# Patient Record
Sex: Female | Born: 1985 | Race: Black or African American | Hispanic: No | Marital: Single | Smoking: Never smoker
Health system: Southern US, Community
[De-identification: ages and names within clinical notes are randomized; demographics above are authoritative.]

## PROBLEM LIST (undated history)

## (undated) ENCOUNTER — Inpatient Hospital Stay (HOSPITAL_COMMUNITY): Payer: Self-pay

## (undated) DIAGNOSIS — O139 Gestational [pregnancy-induced] hypertension without significant proteinuria, unspecified trimester: Secondary | ICD-10-CM

## (undated) DIAGNOSIS — A549 Gonococcal infection, unspecified: Secondary | ICD-10-CM

## (undated) DIAGNOSIS — A749 Chlamydial infection, unspecified: Secondary | ICD-10-CM

## (undated) DIAGNOSIS — D649 Anemia, unspecified: Secondary | ICD-10-CM

## (undated) DIAGNOSIS — Z789 Other specified health status: Secondary | ICD-10-CM

## (undated) DIAGNOSIS — J45909 Unspecified asthma, uncomplicated: Secondary | ICD-10-CM

## (undated) DIAGNOSIS — R011 Cardiac murmur, unspecified: Secondary | ICD-10-CM

## (undated) HISTORY — PX: EAR TUBE REMOVAL: SHX1486

## (undated) HISTORY — PX: ADENOIDECTOMY: SHX5191

## (undated) HISTORY — PX: TONSILLECTOMY: SUR1361

---
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
## 2010-01-10 ENCOUNTER — Emergency Department: Payer: Self-pay | Admitting: Emergency Medicine

## 2010-03-22 ENCOUNTER — Emergency Department (HOSPITAL_COMMUNITY): Admission: EM | Admit: 2010-03-22 | Discharge: 2010-03-22 | Payer: Self-pay | Admitting: Emergency Medicine

## 2010-06-20 ENCOUNTER — Emergency Department: Payer: Medicaid Other | Admitting: Emergency Medicine

## 2010-12-07 LAB — RUBELLA ANTIBODY, IGM: Rubella: IMMUNE

## 2010-12-07 LAB — ABO/RH: RH Type: POSITIVE

## 2010-12-07 LAB — GC/CHLAMYDIA PROBE AMP, GENITAL
Chlamydia: NEGATIVE
Gonorrhea: NEGATIVE

## 2010-12-07 LAB — RPR: RPR: NONREACTIVE

## 2010-12-07 LAB — HEPATITIS B SURFACE ANTIGEN: Hepatitis B Surface Ag: NEGATIVE

## 2010-12-07 LAB — CBC: Hemoglobin: 11.4 g/dL — AB (ref 12.0–16.0)

## 2011-05-05 LAB — STREP B DNA PROBE: GBS: NEGATIVE

## 2011-05-11 ENCOUNTER — Inpatient Hospital Stay (HOSPITAL_COMMUNITY)
Admission: AD | Admit: 2011-05-11 | Discharge: 2011-05-11 | Disposition: A | Discharge status: Home / Self Care | Payer: Medicaid Other | Source: Ambulatory Visit | Attending: Obstetrics & Gynecology | Admitting: Obstetrics & Gynecology

## 2011-05-11 ENCOUNTER — Encounter (HOSPITAL_COMMUNITY): Payer: Self-pay | Admitting: *Deleted

## 2011-05-11 DIAGNOSIS — O2390 Unspecified genitourinary tract infection in pregnancy, unspecified trimester: Secondary | ICD-10-CM

## 2011-05-11 DIAGNOSIS — N390 Urinary tract infection, site not specified: Secondary | ICD-10-CM

## 2011-05-11 DIAGNOSIS — M549 Dorsalgia, unspecified: Secondary | ICD-10-CM

## 2011-05-11 DIAGNOSIS — O2340 Unspecified infection of urinary tract in pregnancy, unspecified trimester: Secondary | ICD-10-CM

## 2011-05-11 HISTORY — DX: Other specified health status: Z78.9

## 2011-05-11 LAB — URINALYSIS, ROUTINE W REFLEX MICROSCOPIC
Bilirubin Urine: NEGATIVE
Hgb urine dipstick: NEGATIVE
Ketones, ur: 15 mg/dL — AB
Nitrite: NEGATIVE
Protein, ur: NEGATIVE mg/dL
Specific Gravity, Urine: 1.02 (ref 1.005–1.030)
Urobilinogen, UA: 0.2 mg/dL (ref 0.0–1.0)

## 2011-05-11 MED ORDER — PHENAZOPYRIDINE HCL 100 MG PO TABS: 100.0000 mg | ORAL_TABLET | Freq: Three times a day (TID) | ORAL | Status: AC | PRN

## 2011-05-11 MED ORDER — ZOLPIDEM TARTRATE 5 MG PO TABS
5.0000 mg | ORAL_TABLET | Freq: Every evening | ORAL | Status: DC | PRN
  Administered 2011-05-11: 5 mg via ORAL
  Filled 2011-05-11: qty 1

## 2011-05-11 MED ORDER — CYCLOBENZAPRINE HCL 5 MG PO TABS: 5.0000 mg | ORAL_TABLET | Freq: Three times a day (TID) | ORAL | Status: AC | PRN

## 2011-05-11 MED ORDER — CEPHALEXIN 500 MG PO CAPS: 500.0000 mg | ORAL_CAPSULE | Freq: Four times a day (QID) | ORAL | Status: AC

## 2011-05-11 NOTE — L&D Delivery Note (Signed)
Delivery Note At 2:45 PM a viable and healthy female was delivered via  (Presentation:right occiput ; anterior ).  APGAR:9 ,9 ; 5#14weight .   Placenta status: delivered spontaneously,intact with 3V Cord .  Cord: 3V  Patient delivered a vigorous female infant in ROA presentation weighing 5#14oz.  Infant was passed to maternal abdomen and the cord was clamped and cut.  PLacenta delivered spontaneously, intact with 3VC.  Vagina and perineum were without laceration.  EBL 300.  Uterus was slightly atonic but responded well to bimanual massage. Misoprostal was placed transrectally prophylactly given that patient is not candidate for methergine due to hypertension.  Anesthesia: Epidural  Episiotomy: none Lacerations: none Suture Repair: none Est. Blood Loss (mL): 300 Mom to postpartum.  Baby to nursery-stable.  Rashell Shambaugh H. 05/29/2011, 3:01 PM

## 2011-05-11 NOTE — Progress Notes (Signed)
Pt c/o UTI symptoms; c/o intermittent lower back and lower abdominal pain since this AM; pt's lower back is very uncomfortable;

## 2011-05-11 NOTE — ED Provider Notes (Signed)
Loretta Ellis is a 26 y.o. female @ [redacted]w[redacted]d gestation presents with ? Rupture of membranes.  States she has been feeling different since 5am.  She is a patient of [employer]  On exam there is no vaginal bleeding or pooling of fluid.  Fern test is Negative.  Cervical exam by [facility], RN and reported findings to Dr. Aldona Bar.     Matt Holmes, NP 05/11/11 1729  Results for orders placed during the hospital encounter of 05/11/11 (from the past 24 hour(s))  URINALYSIS, ROUTINE W REFLEX MICROSCOPIC     Status: Abnormal   Collection Time   05/11/11  4:45 PM      Component Value Range   Color, Urine YELLOW  YELLOW    APPearance CLEAR  CLEAR    Specific Gravity, Urine 1.020  1.005 - 1.030    pH 6.5  5.0 - 8.0    Glucose, UA NEGATIVE  NEGATIVE (mg/dL)   Hgb urine dipstick NEGATIVE  NEGATIVE    Bilirubin Urine NEGATIVE  NEGATIVE    Ketones, ur 15 (*) NEGATIVE (mg/dL)   Protein, ur NEGATIVE  NEGATIVE (mg/dL)   Urobilinogen, UA 0.2  0.0 - 1.0 (mg/dL)   Nitrite NEGATIVE  NEGATIVE    Leukocytes, UA MODERATE (*) NEGATIVE   URINE MICROSCOPIC-ADD ON     Status: Abnormal   Collection Time   05/11/11  4:45 PM      Component Value Range   Squamous Epithelial / LPF MANY (*) RARE    WBC, UA 11-20  <3 (WBC/hpf)   Bacteria, UA RARE  RARE    Crystals CA OXALATE CRYSTALS (*) NEGATIVE    Dr. Aldona Bar notified of patient status by RN. Will treat UTI while urine culture pending. He request that the patient have Ambien and Flexeril if needed for her back pain and to help her sleep. She is to follow up in the office.   [place]shine, NP 05/11/11 1811  Lowry, NP 05/11/11 1819

## 2011-05-12 LAB — URINE CULTURE
Colony Count: 25000
Culture  Setup Time: 201301020035

## 2011-05-28 ENCOUNTER — Encounter (HOSPITAL_COMMUNITY): Payer: Self-pay | Admitting: *Deleted

## 2011-05-28 ENCOUNTER — Inpatient Hospital Stay (HOSPITAL_COMMUNITY)
Admission: AD | Admit: 2011-05-28 | Discharge: 2011-05-31 | DRG: 774 | Disposition: A | Discharge status: Home / Self Care | Payer: Medicaid Other | Source: Ambulatory Visit | Attending: Obstetrics and Gynecology | Admitting: Obstetrics and Gynecology

## 2011-05-28 DIAGNOSIS — O99892 Other specified diseases and conditions complicating childbirth: Secondary | ICD-10-CM | POA: Diagnosis present

## 2011-05-28 DIAGNOSIS — O133 Gestational [pregnancy-induced] hypertension without significant proteinuria, third trimester: Secondary | ICD-10-CM

## 2011-05-28 DIAGNOSIS — R030 Elevated blood-pressure reading, without diagnosis of hypertension: Secondary | ICD-10-CM | POA: Diagnosis present

## 2011-05-28 HISTORY — DX: Gonococcal infection, unspecified: A54.9

## 2011-05-28 HISTORY — DX: Chlamydial infection, unspecified: A74.9

## 2011-05-28 LAB — COMPREHENSIVE METABOLIC PANEL
ALT: 13 U/L (ref 0–35)
Albumin: 2.8 g/dL — ABNORMAL LOW (ref 3.5–5.2)
Calcium: 9.7 mg/dL (ref 8.4–10.5)
GFR calc Af Amer: >90 mL/min (ref >90)
Glucose, Bld: 113 mg/dL — ABNORMAL HIGH (ref 70–99)
Sodium: 136 mEq/L (ref 135–145)
Total Protein: 6.6 g/dL (ref 6.0–8.3)

## 2011-05-28 LAB — URINALYSIS, ROUTINE W REFLEX MICROSCOPIC
Glucose, UA: NEGATIVE mg/dL
Protein, ur: NEGATIVE mg/dL
Specific Gravity, Urine: <1.005 — ABNORMAL LOW (ref 1.005–1.030)
Urobilinogen, UA: 0.2 mg/dL (ref 0.0–1.0)

## 2011-05-28 LAB — URIC ACID: Uric Acid, Serum: 5.2 mg/dL (ref 2.4–7.0)

## 2011-05-28 LAB — CBC
Hemoglobin: 10.6 g/dL — ABNORMAL LOW (ref 12.0–15.0)
MCH: 27.7 pg (ref 26.0–34.0)
MCHC: 32.3 g/dL (ref 30.0–36.0)
RDW: 14.9 % (ref 11.5–15.5)

## 2011-05-28 LAB — URINE MICROSCOPIC-ADD ON

## 2011-05-28 MED ORDER — TERBUTALINE SULFATE 1 MG/ML IJ SOLN: 0.2500 mg | Freq: Once | INTRAMUSCULAR | Status: AC | PRN

## 2011-05-28 MED ORDER — LIDOCAINE HCL (PF) 1 % IJ SOLN
30.0000 mL | INTRAMUSCULAR | Status: DC | PRN
  Filled 2011-05-28: qty 30

## 2011-05-28 MED ORDER — OXYCODONE-ACETAMINOPHEN 5-325 MG PO TABS: 2.0000 | ORAL_TABLET | ORAL | Status: DC | PRN

## 2011-05-28 MED ORDER — ONDANSETRON HCL 4 MG/2ML IJ SOLN: 4.0000 mg | Freq: Four times a day (QID) | INTRAMUSCULAR | Status: DC | PRN

## 2011-05-28 MED ORDER — ACETAMINOPHEN 325 MG PO TABS
650.0000 mg | ORAL_TABLET | Freq: Once | ORAL | Status: AC
  Administered 2011-05-28: 650 mg via ORAL
  Filled 2011-05-28: qty 2

## 2011-05-28 MED ORDER — LACTATED RINGERS IV SOLN
INTRAVENOUS | Status: DC
  Administered 2011-05-29 (×2): via INTRAVENOUS

## 2011-05-28 MED ORDER — ACETAMINOPHEN 325 MG PO TABS: 650.0000 mg | ORAL_TABLET | ORAL | Status: DC | PRN

## 2011-05-28 MED ORDER — LACTATED RINGERS IV SOLN: 500.0000 mL | INTRAVENOUS | Status: DC | PRN

## 2011-05-28 MED ORDER — IBUPROFEN 600 MG PO TABS: 600.0000 mg | ORAL_TABLET | Freq: Four times a day (QID) | ORAL | Status: DC | PRN

## 2011-05-28 MED ORDER — BUTORPHANOL TARTRATE 2 MG/ML IJ SOLN
1.0000 mg | INTRAMUSCULAR | Status: DC | PRN
  Administered 2011-05-29 (×3): 1 mg via INTRAVENOUS
  Filled 2011-05-28 (×3): qty 1

## 2011-05-28 MED ORDER — FLEET ENEMA 7-19 GM/118ML RE ENEM: 1.0000 | ENEMA | RECTAL | Status: DC | PRN

## 2011-05-28 MED ORDER — OXYTOCIN 20 UNITS IN LACTATED RINGERS INFUSION - SIMPLE
125.0000 mL/h | Freq: Once | INTRAVENOUS | Status: DC
  Administered 2011-05-29: 125 mL/h via INTRAVENOUS

## 2011-05-28 MED ORDER — OXYTOCIN BOLUS FROM INFUSION
500.0000 mL | Freq: Once | INTRAVENOUS | Status: DC
  Administered 2011-05-29: 500 mL via INTRAVENOUS
  Filled 2011-05-28: qty 500
  Filled 2011-05-28: qty 1000

## 2011-05-28 MED ORDER — ZOLPIDEM TARTRATE 10 MG PO TABS: 10.0000 mg | ORAL_TABLET | Freq: Every evening | ORAL | Status: DC | PRN

## 2011-05-28 MED ORDER — CITRIC ACID-SODIUM CITRATE 334-500 MG/5ML PO SOLN: 30.0000 mL | ORAL | Status: DC | PRN

## 2011-05-28 MED ORDER — LABETALOL HCL 5 MG/ML IV SOLN
10.0000 mg | INTRAVENOUS | Status: DC | PRN
  Filled 2011-05-28: qty 4

## 2011-05-28 MED ORDER — MISOPROSTOL 25 MCG QUARTER TABLET
25.0000 ug | ORAL_TABLET | ORAL | Status: DC | PRN
  Administered 2011-05-28 – 2011-05-29 (×3): 25 ug via VAGINAL
  Filled 2011-05-28 (×3): qty 0.25

## 2011-05-28 NOTE — ED Notes (Signed)
MAU provider to see due to back pain and elevated BP's

## 2011-05-28 NOTE — Progress Notes (Signed)
Patient states she has been having contractions every 7-8 minutes. Denies any leaking or bleeding and reports good fetal movement.

## 2011-05-28 NOTE — H&P (Signed)
Loretta Ellis is a 26 y.o. female presenting for a labor check  26 yo G1P0 @ 39+1 p/w c/o contractions.  Pt was noted to have elevated BPs.  Pre-eclampsia evaluation was negative but due to severe range BPs the decision was made to proceed with induction of labor.  Pregnancy to this point has been uncomplicated.  History OB History    Grav Para Term Preterm Abortions TAB SAB Ect Mult Living   1              Past Medical History  Diagnosis Date  . Gonorrhea   . Chlamydia    Past Surgical History  Procedure Date  . Tonsillectomy   . Ear tube removal   . Adenoidectomy    Family History: family history is negative for Anesthesia problems, and Hypotension, and Malignant hyperthermia, and Pseudochol deficiency, . Social History:  reports that she has never smoked. She does not have any smokeless tobacco history on file. She reports that she does not drink alcohol or use illicit drugs.  ROS  Dilation: 1.5 Effacement (%): 50 Station: -3 Exam by:: H.Norton, RN  Blood pressure 159/92, pulse 82, temperature 97.6 F (36.4 C), temperature source Oral, resp. rate 18, height 5\' 8"  (1.727 m), weight 99.338 kg (219 lb). Exam Physical Exam  Prenatal labs: ABO, Rh: O/Positive/-- (07/30 0000) Antibody: Negative (07/30 0000) Rubella: Immune (07/30 0000) RPR: Nonreactive (07/30 0000)  HBsAg: Negative (07/30 0000)  HIV: Non-reactive (07/30 0000)  GBS: Negative (12/26 0000)   Assessment/Plan: Admit Misoprostal for cervical ripening Antihypertensives for sustained BPs 160s/100-110s  FWB reassuring  Jameon Deller H. 05/28/2011, 9:25 PM

## 2011-05-28 NOTE — ED Provider Notes (Signed)
History     Chief Complaint  Patient presents with  . Labor Eval   HPI 26 y.o. G1P0 at [redacted]w[redacted]d here for labor check, evaluation requested by RN d/t elevated BP. Pt reports recent onset of headache, denies vision changes or abd pain. + fetal movement. Irregular contractions, no bleeding or LOF. No history of HTN prior to this time.    Past Medical History  Diagnosis Date  . Gonorrhea   . Chlamydia     Past Surgical History  Procedure Date  . Tonsillectomy   . Ear tube removal   . Adenoidectomy     No family history on file.  History  Substance Use Topics  . Smoking status: Never Smoker   . Smokeless tobacco: Not on file  . Alcohol Use: No    Allergies: No Known Allergies  Prescriptions prior to admission  Medication Sig Dispense Refill  . Prenatal Vit-Fe Fumarate-FA (PRENATAL MULTIVITAMIN) TABS Take 1 tablet by mouth daily.          Review of Systems  Constitutional: Negative.   Eyes: Negative for blurred vision.  Respiratory: Negative.   Cardiovascular: Negative.   Gastrointestinal: Negative for nausea, vomiting, abdominal pain, diarrhea and constipation.  Genitourinary: Negative for dysuria, urgency, frequency, hematuria and flank pain.       Negative for vaginal bleeding, Positive for contractions  Musculoskeletal: Negative.   Neurological: Positive for headaches.  Psychiatric/Behavioral: Negative.    Physical Exam   Blood pressure 167/94, pulse 73, resp. rate 20. Filed Vitals:   05/28/11 1703 05/28/11 1731 05/28/11 1756 05/28/11 1832  BP: 153/95 150/82 158/89 167/94  Pulse: 71 83 77 73  TempSrc:      Resp:        Physical Exam  Nursing note and vitals reviewed. Constitutional: She is oriented to person, place, and time. She appears well-developed and well-nourished. Distressed: uncomfortable appearing.  Cardiovascular: Normal rate and regular rhythm.   Respiratory: Effort normal. No respiratory distress.  GI: Soft. There is no tenderness.    Musculoskeletal: Normal range of motion.  Neurological: She is alert and oriented to person, place, and time. She has normal reflexes.  Skin: Skin is warm and dry.  Psychiatric: She has a normal mood and affect.   SVE: 1/60/high per RN  MAU Course  Procedures  Results for orders placed during the hospital encounter of 05/28/11 (from the past 24 hour(s))  URINALYSIS, ROUTINE W REFLEX MICROSCOPIC     Status: Abnormal   Collection Time   05/28/11  5:10 PM      Component Value Range   Color, Urine STRAW (*) YELLOW    APPearance CLEAR  CLEAR    Specific Gravity, Urine <1.005 (*) 1.005 - 1.030    pH 6.5  5.0 - 8.0    Glucose, UA NEGATIVE  NEGATIVE (mg/dL)   Hgb urine dipstick TRACE (*) NEGATIVE    Bilirubin Urine NEGATIVE  NEGATIVE    Ketones, ur NEGATIVE  NEGATIVE (mg/dL)   Protein, ur NEGATIVE  NEGATIVE (mg/dL)   Urobilinogen, UA 0.2  0.0 - 1.0 (mg/dL)   Nitrite NEGATIVE  NEGATIVE    Leukocytes, UA SMALL (*) NEGATIVE   URINE MICROSCOPIC-ADD ON     Status: Abnormal   Collection Time   05/28/11  5:10 PM      Component Value Range   Squamous Epithelial / LPF MANY (*) RARE    WBC, UA 3-6  <3 (WBC/hpf)   Bacteria, UA RARE  RARE   CBC  Status: Abnormal   Collection Time   05/28/11  5:40 PM      Component Value Range   WBC 10.8 (*) 4.0 - 10.5 (K/uL)   RBC 3.83 (*) 3.87 - 5.11 (MIL/uL)   Hemoglobin 10.6 (*) 12.0 - 15.0 (g/dL)   HCT 45.4 (*) 09.8 - 46.0 (%)   MCV 85.6  78.0 - 100.0 (fL)   MCH 27.7  26.0 - 34.0 (pg)   MCHC 32.3  30.0 - 36.0 (g/dL)   RDW 11.9  14.7 - 82.9 (%)   Platelets 349  150 - 400 (K/uL)  COMPREHENSIVE METABOLIC PANEL     Status: Abnormal   Collection Time   05/28/11  5:40 PM      Component Value Range   Sodium 136  135 - 145 (mEq/L)   Potassium 3.8  3.5 - 5.1 (mEq/L)   Chloride 106  96 - 112 (mEq/L)   CO2 21  19 - 32 (mEq/L)   Glucose, Bld 113 (*) 70 - 99 (mg/dL)   BUN 5 (*) 6 - 23 (mg/dL)   Creatinine, Ser 5.62  0.50 - 1.10 (mg/dL)   Calcium 9.7   8.4 - 10.5 (mg/dL)   Total Protein 6.6  6.0 - 8.3 (g/dL)   Albumin 2.8 (*) 3.5 - 5.2 (g/dL)   AST 18  0 - 37 (U/L)   ALT 13  0 - 35 (U/L)   Alkaline Phosphatase 149 (*) 39 - 117 (U/L)   Total Bilirubin 0.1 (*) 0.3 - 1.2 (mg/dL)   GFR calc non Af Amer >90  >90 (mL/min)   GFR calc Af Amer >90  >90 (mL/min)  URIC ACID     Status: Normal   Collection Time   05/28/11  5:40 PM      Component Value Range   Uric Acid, Serum 5.2  2.4 - 7.0 (mg/dL)  LACTATE DEHYDROGENASE     Status: Normal   Collection Time   05/28/11  5:40 PM      Component Value Range   LD 190  94 - 250 (U/L)   Headache relieved with Tylenol. Contractions q2-4 minutes now, SVE: 1-2/70/-3 per RN Assessment and Plan  26 y.o. G1P0 at [redacted]w[redacted]d PIH - admit to L&D, Dr. Tenny Craw to put in orders  [facility] 05/28/2011, 6:38 PM

## 2011-05-29 ENCOUNTER — Encounter (HOSPITAL_COMMUNITY): Payer: Self-pay | Admitting: *Deleted

## 2011-05-29 ENCOUNTER — Inpatient Hospital Stay (HOSPITAL_COMMUNITY): Payer: Medicaid Other | Admitting: Anesthesiology

## 2011-05-29 ENCOUNTER — Encounter (HOSPITAL_COMMUNITY): Payer: Self-pay | Admitting: Anesthesiology

## 2011-05-29 LAB — ABO/RH: ABO/RH(D): O POS

## 2011-05-29 MED ORDER — BENZOCAINE-MENTHOL 20-0.5 % EX AERO: 1.0000 "application " | INHALATION_SPRAY | CUTANEOUS | Status: DC | PRN

## 2011-05-29 MED ORDER — EPHEDRINE 5 MG/ML INJ: 10.0000 mg | INTRAVENOUS | Status: DC | PRN

## 2011-05-29 MED ORDER — ONDANSETRON HCL 4 MG PO TABS: 4.0000 mg | ORAL_TABLET | ORAL | Status: DC | PRN

## 2011-05-29 MED ORDER — LACTATED RINGERS IV SOLN: 500.0000 mL | Freq: Once | INTRAVENOUS | Status: DC

## 2011-05-29 MED ORDER — IBUPROFEN 100 MG/5ML PO SUSP
600.0000 mg | Freq: Four times a day (QID) | ORAL | Status: DC | PRN
  Administered 2011-05-30 – 2011-05-31 (×5): 600 mg via ORAL
  Filled 2011-05-29 (×5): qty 30

## 2011-05-29 MED ORDER — IBUPROFEN 600 MG PO TABS
600.0000 mg | ORAL_TABLET | Freq: Four times a day (QID) | ORAL | Status: DC
  Administered 2011-05-29 – 2011-05-30 (×3): 600 mg via ORAL
  Filled 2011-05-29 (×4): qty 1

## 2011-05-29 MED ORDER — SENNOSIDES-DOCUSATE SODIUM 8.6-50 MG PO TABS
2.0000 | ORAL_TABLET | Freq: Every day | ORAL | Status: DC
  Administered 2011-05-29 – 2011-05-30 (×2): 2 via ORAL

## 2011-05-29 MED ORDER — COMPLETENATE 29-1 MG PO CHEW
1.0000 | CHEWABLE_TABLET | Freq: Every day | ORAL | Status: DC
  Administered 2011-05-30 – 2011-05-31 (×2): 1 via ORAL
  Filled 2011-05-29 (×3): qty 1

## 2011-05-29 MED ORDER — EPHEDRINE 5 MG/ML INJ
10.0000 mg | INTRAVENOUS | Status: DC | PRN
  Filled 2011-05-29: qty 4

## 2011-05-29 MED ORDER — BENZOCAINE-MENTHOL 20-0.5 % EX AERO
INHALATION_SPRAY | CUTANEOUS | Status: AC
  Filled 2011-05-29: qty 56

## 2011-05-29 MED ORDER — OXYCODONE-ACETAMINOPHEN 5-325 MG PO TABS
1.0000 | ORAL_TABLET | ORAL | Status: DC | PRN
  Administered 2011-05-30: 1 via ORAL
  Filled 2011-05-29: qty 1

## 2011-05-29 MED ORDER — MISOPROSTOL 200 MCG PO TABS
ORAL_TABLET | ORAL | Status: AC
  Filled 2011-05-29: qty 4

## 2011-05-29 MED ORDER — DIBUCAINE 1 % RE OINT: 1.0000 "application " | TOPICAL_OINTMENT | RECTAL | Status: DC | PRN

## 2011-05-29 MED ORDER — ZOLPIDEM TARTRATE 10 MG PO TABS: 10.0000 mg | ORAL_TABLET | Freq: Every evening | ORAL | Status: DC | PRN

## 2011-05-29 MED ORDER — SIMETHICONE 80 MG PO CHEW: 80.0000 mg | CHEWABLE_TABLET | ORAL | Status: DC | PRN

## 2011-05-29 MED ORDER — PRENATAL MULTIVITAMIN CH
1.0000 | ORAL_TABLET | Freq: Every day | ORAL | Status: DC
  Filled 2011-05-29 (×2): qty 1

## 2011-05-29 MED ORDER — DIPHENHYDRAMINE HCL 25 MG PO CAPS: 25.0000 mg | ORAL_CAPSULE | Freq: Four times a day (QID) | ORAL | Status: DC | PRN

## 2011-05-29 MED ORDER — OXYTOCIN 20 UNITS IN LACTATED RINGERS INFUSION - SIMPLE: 1.0000 m[IU]/min | INTRAVENOUS | Status: DC

## 2011-05-29 MED ORDER — LANOLIN HYDROUS EX OINT: TOPICAL_OINTMENT | CUTANEOUS | Status: DC | PRN

## 2011-05-29 MED ORDER — DIPHENHYDRAMINE HCL 50 MG/ML IJ SOLN: 12.5000 mg | INTRAMUSCULAR | Status: DC | PRN

## 2011-05-29 MED ORDER — FENTANYL 2.5 MCG/ML BUPIVACAINE 1/10 % EPIDURAL INFUSION (WH - ANES)
14.0000 mL/h | INTRAMUSCULAR | Status: DC
  Administered 2011-05-29 (×2): 14 mL/h via EPIDURAL
  Filled 2011-05-29 (×2): qty 60

## 2011-05-29 MED ORDER — PHENYLEPHRINE 40 MCG/ML (10ML) SYRINGE FOR IV PUSH (FOR BLOOD PRESSURE SUPPORT)
80.0000 ug | PREFILLED_SYRINGE | INTRAVENOUS | Status: DC | PRN
  Filled 2011-05-29: qty 5

## 2011-05-29 MED ORDER — TETANUS-DIPHTH-ACELL PERTUSSIS 5-2.5-18.5 LF-MCG/0.5 IM SUSP: 0.5000 mL | Freq: Once | INTRAMUSCULAR | Status: DC

## 2011-05-29 MED ORDER — MISOPROSTOL 200 MCG PO TABS: 800.0000 ug | ORAL_TABLET | ORAL | Status: DC

## 2011-05-29 MED ORDER — PHENYLEPHRINE 40 MCG/ML (10ML) SYRINGE FOR IV PUSH (FOR BLOOD PRESSURE SUPPORT): 80.0000 ug | PREFILLED_SYRINGE | INTRAVENOUS | Status: DC | PRN

## 2011-05-29 MED ORDER — TERBUTALINE SULFATE 1 MG/ML IJ SOLN: 0.2500 mg | Freq: Once | INTRAMUSCULAR | Status: DC | PRN

## 2011-05-29 MED ORDER — ZOLPIDEM TARTRATE 5 MG PO TABS: 5.0000 mg | ORAL_TABLET | Freq: Every evening | ORAL | Status: DC | PRN

## 2011-05-29 MED ORDER — LIDOCAINE HCL 1.5 % IJ SOLN
INTRAMUSCULAR | Status: DC | PRN
  Administered 2011-05-29 (×2): 5 mL via EPIDURAL

## 2011-05-29 MED ORDER — ONDANSETRON HCL 4 MG/2ML IJ SOLN: 4.0000 mg | INTRAMUSCULAR | Status: DC | PRN

## 2011-05-29 MED ORDER — WITCH HAZEL-GLYCERIN EX PADS: 1.0000 "application " | MEDICATED_PAD | CUTANEOUS | Status: DC | PRN

## 2011-05-29 NOTE — Anesthesia Preprocedure Evaluation (Signed)

## 2011-05-29 NOTE — Progress Notes (Signed)
Difficult tracing FHR

## 2011-05-29 NOTE — Anesthesia Procedure Notes (Signed)
Epidural Patient location during procedure: OB Start time: 05/29/2011 6:43 AM  Staffing Anesthesiologist: Brayton Caves R Performed by: anesthesiologist   Preanesthetic Checklist Completed: patient identified, site marked, surgical consent, pre-op evaluation, timeout performed, IV checked, risks and benefits discussed and monitors and equipment checked  Epidural Patient position: sitting Prep: site prepped and draped and DuraPrep Patient monitoring: continuous pulse ox and blood pressure Approach: midline Injection technique: LOR air and LOR saline  Needle:  Needle type: Tuohy  Needle gauge: 17 G Needle length: 9 cm Needle insertion depth: 5 cm cm Catheter type: closed end flexible Catheter size: 19 Gauge Catheter at skin depth: 10 cm Test dose: negative  Assessment Events: blood not aspirated, injection not painful, no injection resistance, negative IV test and no paresthesia  Additional Notes Patient identified.  Risk benefits discussed including failed block, incomplete pain control, headache, nerve damage, paralysis, blood pressure changes, nausea, vomiting, reactions to medication both toxic or allergic, and postpartum back pain.  Patient expressed understanding and wished to proceed.  All questions were answered.  Sterile technique used throughout procedure and epidural site dressed with sterile barrier dressing. No paresthesia or other complications noted.The patient did not experience any signs of intravascular injection such as tinnitus or metallic taste in mouth nor signs of intrathecal spread such as rapid motor block. Please see nursing notes for vital signs.

## 2011-05-29 NOTE — Progress Notes (Signed)
Pt watched by charge nurse from 0930 to 1101

## 2011-05-29 NOTE — Progress Notes (Signed)
Fetal movement very audiable, difficult to trace FHR. SVE, no cord felt. Will cont to assess FHR. Will place FSE in needed.

## 2011-05-29 NOTE — Progress Notes (Signed)
Pt feels warm to touch

## 2011-05-29 NOTE — Anesthesia Postprocedure Evaluation (Signed)
  Anesthesia Post-op Note  Patient: Loretta Ellis  Procedure(s) Performed: * No procedures listed *  Patient Location: PACU and Mother/Baby  Anesthesia Type: Epidural  Level of Consciousness: awake, alert  and oriented  Airway and Oxygen Therapy: Patient Spontanous Breathing   Post-op Assessment: Patient's Cardiovascular Status Stable and Respiratory Function Stable  Post-op Vital Signs: stable  Complications: No apparent anesthesia complications

## 2011-05-29 NOTE — Progress Notes (Signed)
Patient ID: Loretta Ellis, female   DOB: April 29, 1986, 26 y.o.   MRN: [id number]  S: Uncomfortable with contractions. S/p stadol x 2 doses O: Filed Vitals:   05/29/11 0731 05/29/11 0802 05/29/11 0826 05/29/11 0832  BP: 142/75 144/64  150/70  Pulse: 72 71  76  Temp:  97.9 F (36.6 C)    TempSrc:  Oral    Resp:  18 20 18   Height:      Weight:      SpO2:       Uncomfortable appearing cvx 3/80/-2 toco q2 FHT 140 + accels, no decels reactive  AROM scant fluid Will proceed with epidural  A/P BPs improved  FWB reassuring

## 2011-05-30 LAB — CBC
Platelets: 312 10*3/uL (ref 150–400)
RBC: 3.41 MIL/uL — ABNORMAL LOW (ref 3.87–5.11)
RDW: 15 % (ref 11.5–15.5)
WBC: 13.1 10*3/uL — ABNORMAL HIGH (ref 4.0–10.5)

## 2011-05-30 NOTE — Progress Notes (Signed)
Post Partum Day 1 Subjective: no complaints, up ad lib, voiding and tolerating PO  Objective: Blood pressure 161/92, pulse 62, temperature 97.3 F (36.3 C), temperature source Axillary, resp. rate 20, height 5\' 8"  (1.727 m), weight 99.338 kg (219 lb), SpO2 98.00%, unknown if currently breastfeeding.  Physical Exam:  General: alert, cooperative and appears stated age Lochia: appropriate Uterine Fundus: firm DVT Evaluation: No evidence of DVT seen on physical exam.   Basename 05/30/11 0500 05/28/11 1740  HGB 9.4* 10.6*  HCT 29.1* 32.8*    Assessment/Plan: Breastfeeding BPs labile, some still 160/90s.  Will monitor today.  If majority are still elevated then will start labetalol.   LOS: 2 days   Mckinzey Entwistle H. 05/30/2011, 10:19 AM

## 2011-05-31 NOTE — Discharge Summary (Signed)
Obstetric Discharge Summary Reason for Admission: Elevated blood pressures and vaginal bleeding Prenatal Procedures: none Intrapartum Procedures: spontaneous vaginal delivery Postpartum Procedures: none Complications-Operative and Postpartum: none Hemoglobin  Date Value Range Status  05/30/2011 9.4* 12.0-15.0 (g/dL) Final     HCT  Date Value Range Status  05/30/2011 29.1* 36.0-46.0 (%) Final    Discharge Diagnoses: Term Pregnancy-delivered  Discharge Information: Date: 05/31/2011 Activity: unrestricted Diet: routine Medications: PNV, Ibuprofen, Colace and Iron Condition: stable Instructions: refer to practice specific booklet Discharge to: home Follow-up Information    Follow up with Almon Hercules., MD in 4 weeks.   Contact information:   [address] [place] [address] [phone]          Newborn Data: Live born female  Birth Weight: 5 lb 13.8 oz (2659 g) APGAR: 10, 9  Home with mother.  Philip Aspen 05/31/2011, 10:49 AM

## 2011-06-21 ENCOUNTER — Emergency Department (HOSPITAL_COMMUNITY)
Admission: AD | Admit: 2011-06-21 | Discharge: 2011-06-21 | Disposition: A | Discharge status: Home / Self Care | Payer: Medicaid Other | Source: Ambulatory Visit | Attending: Obstetrics and Gynecology | Admitting: Obstetrics and Gynecology

## 2011-06-21 ENCOUNTER — Inpatient Hospital Stay (EMERGENCY_DEPARTMENT_HOSPITAL)
Admission: AD | Admit: 2011-06-21 | Discharge: 2011-06-21 | Disposition: A | Discharge status: Home / Self Care | Payer: Medicaid Other | Source: Ambulatory Visit | Attending: Obstetrics and Gynecology | Admitting: Obstetrics and Gynecology

## 2011-06-21 ENCOUNTER — Encounter (HOSPITAL_COMMUNITY): Payer: Self-pay | Admitting: *Deleted

## 2011-06-21 DIAGNOSIS — R109 Unspecified abdominal pain: Secondary | ICD-10-CM

## 2011-06-21 DIAGNOSIS — R1031 Right lower quadrant pain: Secondary | ICD-10-CM | POA: Insufficient documentation

## 2011-06-21 DIAGNOSIS — K5900 Constipation, unspecified: Secondary | ICD-10-CM | POA: Insufficient documentation

## 2011-06-21 LAB — CBC
HCT: 38 % (ref 36.0–46.0)
Hemoglobin: 11.8 g/dL — ABNORMAL LOW (ref 12.0–15.0)
MCH: 26.9 pg (ref 26.0–34.0)
MCHC: 31.1 g/dL (ref 30.0–36.0)
MCV: 86.6 fL (ref 78.0–100.0)

## 2011-06-21 LAB — DIFFERENTIAL
Basophils Relative: 0 % (ref 0–1)
Eosinophils Absolute: 0.2 10*3/uL (ref 0.0–0.7)
Monocytes Absolute: 0.4 10*3/uL (ref 0.1–1.0)
Monocytes Relative: 4 % (ref 3–12)

## 2011-06-21 LAB — WET PREP, GENITAL

## 2011-06-21 NOTE — Progress Notes (Signed)
Vag del 3 wks ago, no problems with preg, had high BP at end, no MGSO4.   Today was having a bowel movement, started having pain while passing stool, afterwards bottom low back and RLQ started hurting.  Pain is now just in low back and low abd.  Denies constipation.

## 2011-06-21 NOTE — ED Provider Notes (Signed)
History     CSN: [id number]  Arrival date & time 06/21/11  1535   None     Chief Complaint  Patient presents with  . Abdominal Pain    HPI Loretta Ellis is a 26 y.o. female who presents to MAU for abdominal pain post vaginal delivery 05/29/11. No complications with delivery. Today had BM approximately 2 pm that was hard and then about 30 minutes later started having lower abdominal pain on the right side. Describes the pain as sharp that is constant. Walking, movement and applying pressure to the area increases the pain. Patient is not breast feeding and is using no birth control. The history was provided by the patient.   Past Medical History  Diagnosis Date  . Gonorrhea   . Chlamydia   . No pertinent past medical history     Past Surgical History  Procedure Date  . Tonsillectomy   . Ear tube removal   . Adenoidectomy     Family History  Problem Relation Age of Onset  . Anesthesia problems Neg Hx   . Hypotension Neg Hx   . Malignant hyperthermia Neg Hx   . Pseudochol deficiency Neg Hx     History  Substance Use Topics  . Smoking status: Never Smoker   . Smokeless tobacco: Not on file  . Alcohol Use: No    OB History    Grav Para Term Preterm Abortions TAB SAB Ect Mult Living   1 1 1       1       Review of Systems  Constitutional: Positive for appetite change. Negative for fever, chills, diaphoresis and fatigue.  HENT: Negative for ear pain, congestion, sore throat, facial swelling, neck pain, neck stiffness, dental problem and sinus pressure.   Eyes: Negative for photophobia, pain and discharge.  Respiratory: Negative for cough, chest tightness and wheezing.   Cardiovascular: Negative.   Gastrointestinal: Positive for abdominal pain and constipation. Negative for nausea, vomiting, diarrhea and abdominal distention.  Genitourinary: Negative for dysuria, frequency, flank pain, vaginal bleeding, vaginal discharge, difficulty urinating and pelvic pain.    Musculoskeletal: Negative for myalgias, back pain and gait problem.  Skin: Negative for color change and rash.  Neurological: Negative for dizziness, speech difficulty, weakness, light-headedness, numbness and headaches.  Psychiatric/Behavioral: Negative for confusion and agitation. The patient is not nervous/anxious.     Allergies  Review of patient's allergies indicates no known allergies.  Home Medications  No current outpatient prescriptions on file.  BP 128/79  Pulse 78  Temp(Src) 98.2 F (36.8 C) (Oral)  Resp 20  Breastfeeding? No  Physical Exam  Nursing note and vitals reviewed. Constitutional: She is oriented to person, place, and time. She appears well-developed and well-nourished.  Eyes: EOM are normal.  Neck: Neck supple.  Pulmonary/Chest: Effort normal.  Abdominal: Soft. There is tenderness in the right lower quadrant. There is no rigidity, no rebound, no guarding and no CVA tenderness.  Musculoskeletal: Normal range of motion.  Neurological: She is alert and oriented to person, place, and time. No cranial nerve deficit.  Skin: Skin is warm and dry.  Psychiatric: She has a normal mood and affect. Her behavior is normal. Judgment and thought content normal.   Results for orders placed during the hospital encounter of 06/21/11 (from the past 24 hour(s))  CBC     Status: Abnormal   Collection Time   06/21/11  6:58 PM      Component Value Range   WBC 9.8  4.0 -  10.5 (K/uL)   RBC 4.39  3.87 - 5.11 (MIL/uL)   Hemoglobin 11.8 (*) 12.0 - 15.0 (g/dL)   HCT 78.2  95.6 - 21.3 (%)   MCV 86.6  78.0 - 100.0 (fL)   MCH 26.9  26.0 - 34.0 (pg)   MCHC 31.1  30.0 - 36.0 (g/dL)   RDW 08.6  57.8 - 46.9 (%)   Platelets 409 (*) 150 - 400 (K/uL)  DIFFERENTIAL     Status: Normal   Collection Time   06/21/11  6:58 PM      Component Value Range   Neutrophils Relative 64  43 - 77 (%)   Neutro Abs 6.3  1.7 - 7.7 (K/uL)   Lymphocytes Relative 29  12 - 46 (%)   Lymphs Abs 2.9  0.7 -  4.0 (K/uL)   Monocytes Relative 4  3 - 12 (%)   Monocytes Absolute 0.4  0.1 - 1.0 (K/uL)   Eosinophils Relative 2  0 - 5 (%)   Eosinophils Absolute 0.2  0.0 - 0.7 (K/uL)   Basophils Relative 0  0 - 1 (%)   Basophils Absolute 0.0  0.0 - 0.1 (K/uL)  WET PREP, GENITAL     Status: Abnormal   Collection Time   06/21/11  8:10 PM      Component Value Range   Yeast Wet Prep HPF POC NONE SEEN  NONE SEEN    Trich, Wet Prep NONE SEEN  NONE SEEN    Clue Cells Wet Prep HPF POC FEW (*) NONE SEEN    WBC, Wet Prep HPF POC FEW (*) NONE SEEN   PREGNANCY, URINE     Status: Normal   Collection Time   06/21/11  8:12 PM      Component Value Range   Preg Test, Ur NEGATIVE  NEGATIVE   POCT PREGNANCY, URINE     Status: Normal   Collection Time   06/21/11  8:16 PM      Component Value Range   Preg Test, Ur NEGATIVE  NEGATIVE    Assessment: Constipation  Plan:  Stool softener   Increase po fluids   Return for problems    ED Course: Discussed with Dr. Tenny Craw and will discharge patient home to use stool softener and follow up in the office.   Procedures   MDM          Kerrie Buffalo, NP 06/21/11 2052

## 2011-06-21 NOTE — Progress Notes (Addendum)
Pt in c/o sudden onset on right sided sharp lower abdominal pain after bowel movement.  States having the bowel movement was painful, but did not feel like she was constipated before.  Denies any vaginal issues.  SVD 3 weeks ago on 05/29/11.

## 2011-06-22 LAB — GC/CHLAMYDIA PROBE AMP, GENITAL: Chlamydia, DNA Probe: NEGATIVE

## 2012-04-20 ENCOUNTER — Encounter (HOSPITAL_COMMUNITY): Payer: Self-pay | Admitting: *Deleted

## 2012-04-20 ENCOUNTER — Emergency Department (HOSPITAL_COMMUNITY)
Admission: EM | Admit: 2012-04-20 | Discharge: 2012-04-20 | Disposition: A | Discharge status: Home / Self Care | Payer: Self-pay | Attending: Emergency Medicine | Admitting: Emergency Medicine

## 2012-04-20 ENCOUNTER — Emergency Department (HOSPITAL_COMMUNITY): Payer: Self-pay

## 2012-04-20 DIAGNOSIS — R071 Chest pain on breathing: Secondary | ICD-10-CM | POA: Insufficient documentation

## 2012-04-20 DIAGNOSIS — Z8619 Personal history of other infectious and parasitic diseases: Secondary | ICD-10-CM | POA: Insufficient documentation

## 2012-04-20 MED ORDER — KETOROLAC TROMETHAMINE 60 MG/2ML IM SOLN
60.0000 mg | Freq: Once | INTRAMUSCULAR | Status: AC
  Administered 2012-04-20: 60 mg via INTRAMUSCULAR
  Filled 2012-04-20: qty 2

## 2012-04-20 NOTE — ED Notes (Signed)
Pt from work with reports of intermittent, central chest pain that started a few months ago. Pt reports that she was lifting boxes today at work and after an argument with coworker began to hurt in chest again. Pt denies shortness of breath, N/V, dizziness, no diaphoresis noted. Pt reports that pain is worse when taking a deep breath and is reproducible with palpation and taking a deep breath.

## 2012-04-20 NOTE — ED Notes (Signed)
EMS reports that pt was at work when she lifted a box over her head while arguing with a coworker when she began having chest pain which is reproducible with palpation.

## 2012-04-20 NOTE — ED Provider Notes (Signed)
History     CSN: [id number]  Arrival date & time 04/20/12  1545   First MD Initiated Contact with Patient 04/20/12 1613      Chief Complaint  Patient presents with  . Chest Pain    chest wall    (Consider location/radiation/quality/duration/timing/severity/associated sxs/prior treatment) The history is provided by the patient.  [place] Asbury is a 26 y.o. female here with chest pain. She had an argument with her coworker and then lifted a box unto a conveyer belt. She then had pain in her sternal area. She said its dull ache, worse with movement. No SOB or palpitations. No recent travel or hx of DVT/PE. She had intermittent chest pain over the past few months, worse with movement. She has been moving boxes a lot as part of her work. She is also more stressed than usual but denies depression, suicidal or homicidal ideation. She also had difficulty swallowing food occasionally but denies vomiting or dehydration. Not a smoker, grandpa had MI age 60s, no hx of DM, HL, or HTN.    Past Medical History  Diagnosis Date  . Gonorrhea   . Chlamydia   . No pertinent past medical history     Past Surgical History  Procedure Date  . Tonsillectomy   . Ear tube removal   . Adenoidectomy     Family History  Problem Relation Age of Onset  . Anesthesia problems Neg Hx   . Hypotension Neg Hx   . Malignant hyperthermia Neg Hx   . Pseudochol deficiency Neg Hx     History  Substance Use Topics  . Smoking status: Never Smoker   . Smokeless tobacco: Never Used  . Alcohol Use: No    OB History    Grav Para Term Preterm Abortions TAB SAB Ect Mult Living   1 1 1       1       Review of Systems  Cardiovascular: Positive for chest pain.  All other systems reviewed and are negative.    Allergies  Review of patient's allergies indicates no known allergies.  Home Medications  No current outpatient prescriptions on file.  BP 139/89  Pulse 79  Temp 98.4 F (36.9 C) (Oral)  Resp 18   Wt 165 lb (74.844 kg)  SpO2 100%  LMP 03/31/2012  Breastfeeding? No  Physical Exam  Nursing note and vitals reviewed. Constitutional: She is oriented to person, place, and time. She appears well-developed and well-nourished.       Tearful   HENT:  Head: Normocephalic.  Mouth/Throat: Oropharynx is clear and moist.  Eyes: Conjunctivae normal are normal. Pupils are equal, round, and reactive to light.  Neck: Normal range of motion. Neck supple.  Cardiovascular: Normal rate, regular rhythm and normal heart sounds.   Pulmonary/Chest: Effort normal and breath sounds normal. No respiratory distress. She has no wheezes. She has no rales.       + reproducible sternal tenderness   Abdominal: Soft. Bowel sounds are normal. She exhibits no distension. There is no tenderness. There is no rebound.  Musculoskeletal: Normal range of motion. She exhibits no edema and no tenderness.       No calf tenderness   Neurological: She is alert and oriented to person, place, and time.  Skin: Skin is warm and dry.  Psychiatric: She has a normal mood and affect. Her behavior is normal. Judgment and thought content normal.    ED Course  Procedures (including critical care time)  Labs Reviewed - No  data to display Dg Neck Soft Tissue  04/20/2012  *RADIOLOGY REPORT*  Clinical Data: Chest pain, difficulty swallowing  NECK SOFT TISSUES - 1+ VIEW  Comparison: None  Findings: Prevertebral soft tissues normal thickness. Epiglottis and aryepiglottic folds grossly unremarkable. Airway patent. Artifacts from patient's hair. No gross acute bony findings.  IMPRESSION: No acute abnormalities.   Original Report Authenticated By: Ulyses Southward, M.D.    Dg Chest 2 View  04/20/2012  *RADIOLOGY REPORT*  Clinical Data: Chest pain and difficulty swallowing.  CHEST - 2 VIEW  Comparison: None  Findings: The cardiac silhouette, mediastinal and hilar contours are normal.  The lungs are clear.  No pleural effusion.  The bony thorax is  intact.  IMPRESSION: Normal chest x-ray.   Original Report Authenticated By: Rudie Meyer, M.D.      No diagnosis found.   Date: 04/20/2012  Rate: 85  Rhythm: normal sinus rhythm  QRS Axis: normal  Intervals: normal  ST/T Wave abnormalities: nonspecific ST changes  Conduction Disutrbances:none  Narrative Interpretation:   Old EKG Reviewed: none available    MDM  Earlena Werst is a 26 y.o. female here with costochondritis. Very low risk for ACS and very atypical history for ACS. Not concerning for PE. Will get xrays, give toradol and reassess. Recommend that she follows up with her doctor regarding dysphagia symptoms.   5:39 PM Patient felt better after meds. CXR, neck xray nl. Will d/c home. Will give her list of places to follow up.        Richardean Canal, MD 04/20/12 1739

## 2013-02-20 ENCOUNTER — Encounter (HOSPITAL_COMMUNITY): Payer: Self-pay | Admitting: Emergency Medicine

## 2013-02-20 ENCOUNTER — Emergency Department (HOSPITAL_COMMUNITY)
Admission: EM | Admit: 2013-02-20 | Discharge: 2013-02-20 | Disposition: A | Discharge status: Home / Self Care | Payer: Medicaid Other | Attending: Emergency Medicine | Admitting: Emergency Medicine

## 2013-02-20 DIAGNOSIS — M79604 Pain in right leg: Secondary | ICD-10-CM

## 2013-02-20 DIAGNOSIS — R609 Edema, unspecified: Secondary | ICD-10-CM | POA: Insufficient documentation

## 2013-02-20 DIAGNOSIS — M79609 Pain in unspecified limb: Secondary | ICD-10-CM | POA: Insufficient documentation

## 2013-02-20 MED ORDER — HYDROCODONE-ACETAMINOPHEN 5-325 MG PO TABS
2.0000 | ORAL_TABLET | Freq: Once | ORAL | Status: AC
  Administered 2013-02-20: 2 via ORAL
  Filled 2013-02-20: qty 2

## 2013-02-20 MED ORDER — TRAMADOL HCL 50 MG PO TABS: 50.0000 mg | ORAL_TABLET | Freq: Four times a day (QID) | ORAL | Status: DC | PRN

## 2013-02-20 NOTE — ED Notes (Addendum)
Pt states she had left arm "tightness" 2 days ago which has now resolved. Today developed right leg "tightness". Denies N/V/D, no fever or abd pain. Reports mild lower back pain. LMP 01/27/13.  Extremities warm with 2+ pedal pulses.

## 2013-02-20 NOTE — Progress Notes (Signed)
*  PRELIMINARY RESULTS* Vascular Ultrasound Right lower extremity venous duplex has been completed.  Preliminary findings: no evidence of DVT or baker's cyst    Farrel Demark, RDMS, RVT  02/20/2013, 12:26 PM

## 2013-02-20 NOTE — ED Notes (Signed)
Pt c/o right leg pain with some tightness x 2 days; pt denies obvious injury; CMS intact

## 2013-02-20 NOTE — ED Provider Notes (Signed)
CSN: [id number]     Arrival date & time 02/20/13  1008 History   First MD Initiated Contact with Patient 02/20/13 1116     Chief Complaint  Patient presents with  . Leg Pain   (Consider location/radiation/quality/duration/timing/severity/associated sxs/prior Treatment) HPI Comments: Patient is a 27 year old female with no significant past medical history who presents with a 2 day history of right leg pain. Symptoms started gradually and progressively worsened since the onset. The pain is aching and severe and located in her right calf. Palpation of the area makes the pain worse. No alleviating factors. Patient reports associated "leg tightness" and mild swelling. Patient denies any recent travel/surgery, no previous DVT PE, and no oral contraceptive.    Past Medical History  Diagnosis Date  . Gonorrhea   . Chlamydia   . No pertinent past medical history    Past Surgical History  Procedure Laterality Date  . Tonsillectomy    . Ear tube removal    . Adenoidectomy     Family History  Problem Relation Age of Onset  . Anesthesia problems Neg Hx   . Hypotension Neg Hx   . Malignant hyperthermia Neg Hx   . Pseudochol deficiency Neg Hx    History  Substance Use Topics  . Smoking status: Never Smoker   . Smokeless tobacco: Never Used  . Alcohol Use: No   OB History   Grav Para Term Preterm Abortions TAB SAB Ect Mult Living   1 1 1       1      Review of Systems  Cardiovascular: Positive for leg swelling.  Musculoskeletal: Positive for myalgias.  All other systems reviewed and are negative.    Allergies  Review of patient's allergies indicates no known allergies.  Home Medications  No current outpatient prescriptions on file. BP 130/71  Pulse 77  Temp(Src) 98.5 F (36.9 C) (Oral)  Resp 18  Ht 5\' 8"  (1.727 m)  Wt 196 lb 6.4 oz (89.086 kg)  BMI 29.87 kg/m2  SpO2 100% Physical Exam  Nursing note and vitals reviewed. Constitutional: She is oriented to person, place,  and time. She appears well-developed and well-nourished. No distress.  HENT:  Head: Normocephalic and atraumatic.  Eyes: Conjunctivae are normal.  Neck: Normal range of motion.  Cardiovascular: Normal rate, regular rhythm and intact distal pulses.  Exam reveals no gallop and no friction rub.   No murmur heard. Pulmonary/Chest: Effort normal and breath sounds normal. She has no wheezes. She has no rales. She exhibits no tenderness.  Abdominal: Soft. She exhibits no distension. There is no tenderness. There is no rebound and no guarding.  Musculoskeletal: Normal range of motion.  Mild right lower leg edema and calf tenderness to palpation. No obvious deformity.   Neurological: She is alert and oriented to person, place, and time. Coordination normal.  Speech is goal-oriented. Moves limbs without ataxia.   Skin: Skin is warm and dry.  Psychiatric: She has a normal mood and affect. Her behavior is normal.    ED Course  Procedures (including critical care time) Labs Review Labs Reviewed - No data to display Imaging Review No results found.  EKG Interpretation   None       MDM   1. Right leg pain     11:39 AM Patient will have DVT study to rule out DVT of right leg. Patient given Vicodin for pain. Distal pulses intact. Vitals stable and patient afebrile.   3:13 PM DVT study unremarkable. Patient  will be discharged with tramadol for pain and instructions to return with worsening or concerning symptoms. Vitals stable and patient afebrile.    Emilia Beck, PA-C 02/20/13 1513

## 2013-02-21 NOTE — ED Provider Notes (Signed)
Medical screening examination/treatment/procedure(s) were performed by non-physician practitioner and as supervising physician I was immediately available for consultation/collaboration.   Vic Esco M Shereen Marton, DO 02/21/13 2212 

## 2013-06-13 ENCOUNTER — Emergency Department (HOSPITAL_COMMUNITY)
Admission: EM | Admit: 2013-06-13 | Discharge: 2013-06-13 | Disposition: A | Discharge status: Home / Self Care | Payer: Medicaid Other | Attending: Emergency Medicine | Admitting: Emergency Medicine

## 2013-06-13 ENCOUNTER — Encounter (HOSPITAL_COMMUNITY): Payer: Self-pay | Admitting: Emergency Medicine

## 2013-06-13 DIAGNOSIS — R05 Cough: Secondary | ICD-10-CM | POA: Insufficient documentation

## 2013-06-13 DIAGNOSIS — R011 Cardiac murmur, unspecified: Secondary | ICD-10-CM | POA: Insufficient documentation

## 2013-06-13 DIAGNOSIS — R079 Chest pain, unspecified: Secondary | ICD-10-CM

## 2013-06-13 DIAGNOSIS — R059 Cough, unspecified: Secondary | ICD-10-CM | POA: Insufficient documentation

## 2013-06-13 DIAGNOSIS — R110 Nausea: Secondary | ICD-10-CM | POA: Insufficient documentation

## 2013-06-13 DIAGNOSIS — R0789 Other chest pain: Secondary | ICD-10-CM | POA: Insufficient documentation

## 2013-06-13 DIAGNOSIS — Z8619 Personal history of other infectious and parasitic diseases: Secondary | ICD-10-CM | POA: Insufficient documentation

## 2013-06-13 MED ORDER — IBUPROFEN 800 MG PO TABS
800.0000 mg | ORAL_TABLET | Freq: Once | ORAL | Status: AC
  Administered 2013-06-13: 800 mg via ORAL
  Filled 2013-06-13: qty 1

## 2013-06-13 NOTE — ED Notes (Signed)
MD at bedside. 

## 2013-06-13 NOTE — ED Provider Notes (Signed)
CSN: [id number]     Arrival date & [id number] 06/13/13  [id number] History   First MD Initiated Contact with Patient 06/13/13 [phone]     Chief Complaint  Patient presents with  . Chest Pain    Patient is a 28 y.o. female presenting with chest pain. The history is provided by the patient.  Chest Pain Pain location:  Substernal area Pain quality: aching   Pain radiates to:  Does not radiate Pain radiates to the back: no   Pain severity:  Mild Onset quality:  Gradual Duration:  1 day Timing:  Constant Progression:  Unchanged Relieved by:  Nothing Worsened by:  Coughing and certain positions Associated symptoms: cough and nausea   Associated symptoms: no abdominal pain, no shortness of breath, no syncope, not vomiting and no weakness   Pt reports for past 2 days she has had cough, congestion, sore throat and chills Over the past day she has noted chest wall pain with cough and movement of upper body No fever recorded No SOB No hemoptysis No LE pain/edema No h/o DVT/PE She has no known medical problems, but she reports she is known to have heart murmur as child   Past Medical History  Diagnosis Date  . Gonorrhea   . Chlamydia   . No pertinent past medical history    Past Surgical History  Procedure Laterality Date  . Tonsillectomy    . Ear tube removal    . Adenoidectomy     Family History  Problem Relation Age of Onset  . Anesthesia problems Neg Hx   . Hypotension Neg Hx   . Malignant hyperthermia Neg Hx   . Pseudochol deficiency Neg Hx    History  Substance Use Topics  . Smoking status: Never Smoker   . Smokeless tobacco: Never Used  . Alcohol Use: No   OB History   Grav Para Term Preterm Abortions TAB SAB Ect Mult Living   1 1 1       1      Review of Systems  Respiratory: Positive for cough. Negative for shortness of breath.   Cardiovascular: Positive for chest pain. Negative for syncope.  Gastrointestinal: Positive for nausea. Negative for vomiting and abdominal pain.   Neurological: Negative for weakness.  All other systems reviewed and are negative.    Allergies  Review of patient's allergies indicates no known allergies.  Home Medications   Current Outpatient Rx  Name  Route  Sig  Dispense  Refill  . acetaminophen (TYLENOL) 325 MG tablet   Oral   Take 650 mg by mouth every 6 (six) hours as needed for mild pain.          BP 127/77  Pulse 88  Temp(Src) 97.8 F (36.6 C) (Oral)  Resp 16  SpO2 99%  LMP 05/24/2013 Physical Exam CONSTITUTIONAL: Well developed/well nourished HEAD: Normocephalic/atraumatic EYES: EOMI/PERRL ENMT: Mucous membranes moist, nasal congestion, uvula midline, no erythema to pharynx NECK: supple no meningeal signs SPINE:entire spine nontender CV: S1/S2 noted,soft murmur noted Chest - central tenderness to palpation LUNGS: Lungs are clear to auscultation bilaterally, no apparent distress ABDOMEN: soft, nontender, no rebound or guarding GU:no cva tenderness NEURO: Pt is awake/alert, moves all extremitiesx4 EXTREMITIES: pulses normal, full ROM, no calf tenderness noted, no LE edema noted SKIN: warm, color normal PSYCH: no abnormalities of mood noted  ED Course  Procedures (including critical care time) Labs Review Labs Reviewed - No data to display Imaging Review No results found.  EKG Interpretation  Date/Time:  Wednesday June 13 2013 07:04:27 EST Ventricular Rate:  76 PR Interval:  148 QRS Duration: 82 QT Interval:  350 QTC Calculation: 393 R Axis:   84 Text Interpretation:  Normal sinus rhythm Nonspecific T wave abnormality Abnormal ECG No significant change since last tracing Confirmed by Bebe Shaggy  MD, Tymon Nemetz ([id number]) on 06/13/2013 7:10:07 AM            MDM   1. Chest pain   2. Cough   3. Heart murmur    Nursing notes including past medical history and social history reviewed and considered in documentation  Pt with likely viral URI presenting with cough, congestion and now Chest  wall pain I doubt ACS/PE/Dissection at this time (pt is PERC negative) I did advise f/u with cardiology for her heart murmur to be evaluated (reported this was known as child but never had followup) We discussed strict return precautions    Joya Gaskins, MD 06/13/13 [phone]

## 2013-06-13 NOTE — Discharge Instructions (Signed)

## 2013-06-13 NOTE — ED Notes (Signed)
Pt. reports mid chest pain / tightness with productive cough , nausea and body aches onset last night .

## 2013-10-31 ENCOUNTER — Emergency Department (HOSPITAL_COMMUNITY)
Admission: EM | Admit: 2013-10-31 | Discharge: 2013-10-31 | Disposition: A | Discharge status: Home / Self Care | Payer: Medicaid Other | Attending: Emergency Medicine | Admitting: Emergency Medicine

## 2013-10-31 ENCOUNTER — Encounter (HOSPITAL_COMMUNITY): Payer: Self-pay | Admitting: Emergency Medicine

## 2013-10-31 DIAGNOSIS — X503XXA Overexertion from repetitive movements, initial encounter: Secondary | ICD-10-CM | POA: Insufficient documentation

## 2013-10-31 DIAGNOSIS — M62838 Other muscle spasm: Secondary | ICD-10-CM | POA: Insufficient documentation

## 2013-10-31 DIAGNOSIS — Y9389 Activity, other specified: Secondary | ICD-10-CM | POA: Insufficient documentation

## 2013-10-31 DIAGNOSIS — Z791 Long term (current) use of non-steroidal anti-inflammatories (NSAID): Secondary | ICD-10-CM | POA: Insufficient documentation

## 2013-10-31 DIAGNOSIS — Y9289 Other specified places as the place of occurrence of the external cause: Secondary | ICD-10-CM | POA: Insufficient documentation

## 2013-10-31 DIAGNOSIS — S6990XA Unspecified injury of unspecified wrist, hand and finger(s), initial encounter: Principal | ICD-10-CM

## 2013-10-31 DIAGNOSIS — Z79899 Other long term (current) drug therapy: Secondary | ICD-10-CM | POA: Insufficient documentation

## 2013-10-31 DIAGNOSIS — S59909A Unspecified injury of unspecified elbow, initial encounter: Secondary | ICD-10-CM | POA: Insufficient documentation

## 2013-10-31 DIAGNOSIS — Z8619 Personal history of other infectious and parasitic diseases: Secondary | ICD-10-CM | POA: Insufficient documentation

## 2013-10-31 DIAGNOSIS — S59919A Unspecified injury of unspecified forearm, initial encounter: Principal | ICD-10-CM

## 2013-10-31 DIAGNOSIS — T148XXA Other injury of unspecified body region, initial encounter: Secondary | ICD-10-CM

## 2013-10-31 MED ORDER — NAPROXEN 500 MG PO TABS: 500.0000 mg | ORAL_TABLET | Freq: Two times a day (BID) | ORAL | Status: DC

## 2013-10-31 MED ORDER — CYCLOBENZAPRINE HCL 10 MG PO TABS
10.0000 mg | ORAL_TABLET | Freq: Two times a day (BID) | ORAL | Status: DC | PRN
Start: 2013-10-31 — End: 2015-11-23

## 2013-10-31 NOTE — ED Provider Notes (Signed)
CSN: [id number]     Arrival date & time 10/31/13  1452 History   First MD Initiated Contact with Patient 10/31/13 1513     Chief Complaint  Patient presents with  . Wrist Pain  . Arm Pain     (Consider location/radiation/quality/duration/timing/severity/associated sxs/prior Treatment) HPI Comments: Loretta Ellis is a 28 y.o. Female with no PMHx who presents with R arm pain x 1 week. States that it began in her wrist as a moderate sharp and throbbing pain, and then started to hurt into her shoulder and neck which feels tight. Has been using a wrist splint which helps, but the pain in her arm has been unrelieved by tylenol and immobilization. Worse with movement of her wrist. 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  Denies fevers/chills, HA, neck pain/stiffness, CP, SOB, DOE, hemoptysis, chest tightness/pressure, jaw or back pain, diaphoresis, numbness/paresthesias, swelling in extremities, or weakness. Not using OCPs. No recent travel or surgery, no LE swelling or pain, no hx of DVT/PE.   Patient is a 28 y.o. female presenting with wrist pain. The history is provided by the patient. No language interpreter was used.  Wrist Pain This is a new problem. The current episode started in the past 7 days. The problem occurs constantly. The problem has been gradually worsening. Pertinent negatives include no arthralgias, chest pain, chills, coughing, diaphoresis, fever, headaches, joint swelling, myalgias, nausea, neck pain, numbness, rash, vomiting or weakness. Exacerbated by: use of right arm. She has tried immobilization and acetaminophen for the symptoms. The treatment provided no relief.    Past Medical History  Diagnosis Date  . Gonorrhea   . Chlamydia   . No pertinent past medical history    Past Surgical History  Procedure Laterality Date  . Tonsillectomy    . Ear tube removal    . Adenoidectomy     Family History  Problem Relation Age of Onset  .  Anesthesia problems Neg Hx   . Hypotension Neg Hx   . Malignant hyperthermia Neg Hx   . Pseudochol deficiency Neg Hx    History  Substance Use Topics  . Smoking status: Never Smoker   . Smokeless tobacco: Never Used  . Alcohol Use: No   OB History   Grav Para Term Preterm Abortions TAB SAB Ect Mult Living   1 1 1       1      Review of Systems  Constitutional: Negative for fever, chills and diaphoresis.  Eyes: Negative for visual disturbance.  Respiratory: Negative for cough, chest tightness, shortness of breath and wheezing.   Cardiovascular: Negative for chest pain and leg swelling.  Gastrointestinal: Negative for nausea and vomiting.  Genitourinary: Negative for dysuria.  Musculoskeletal: Negative for arthralgias, back pain, gait problem, joint swelling, myalgias, neck pain and neck stiffness.  Skin: Negative for rash.  Neurological: Negative for dizziness, syncope, weakness, numbness and headaches.      Allergies  Review of patient's allergies indicates no known allergies.  Home Medications   Prior to Admission medications   Medication Sig Start Date End Date Taking? Authorizing Provider  cyclobenzaprine (FLEXERIL) 10 MG tablet Take 1 tablet (10 mg total) by mouth 2 (two) times daily as needed for muscle spasms. 10/31/13   Mercedes Strupp Camprubi-Soms, PA-C  naproxen (NAPROSYN) 500 MG tablet Take 1 tablet (500 mg total) by mouth 2 (two) times daily. 10/31/13   Mercedes Strupp Camprubi-Soms, PA-C   BP 132/80  Pulse 78  Temp(Src) 99.6 F (37.6 C) (Oral)  Resp 16  SpO2 100% Physical Exam  Nursing note and vitals reviewed. Constitutional: She is oriented to person, place, and time. Vital signs are normal. She appears well-developed and well-nourished. No distress.  HENT:  Head: Normocephalic and atraumatic.  Nose: Nose normal.  Mouth/Throat: Oropharynx is clear and moist and mucous membranes are normal.  Eyes: Conjunctivae and EOM are normal. Right eye exhibits no  discharge. Left eye exhibits no discharge.  Neck: Normal range of motion. Neck supple. Muscular tenderness present. No spinous process tenderness present. No rigidity. Normal range of motion present.  No spinous process TTP, FROM intact in all directions, mild paracervical TTP on right side with some spasm felt in trapezius muscle. No swelling or meningeal signs.  Cardiovascular: Normal rate, regular rhythm, normal heart sounds and intact distal pulses.   No murmur heard. Pulmonary/Chest: Effort normal and breath sounds normal. She has no decreased breath sounds. She has no wheezes. She has no rales.  CTAB anteriorly and posteriorly  Abdominal: Normal appearance.  Musculoskeletal:       Right shoulder: She exhibits tenderness (diffusely) and spasm. She exhibits normal range of motion, no bony tenderness, no swelling, no crepitus and no deformity.       Right elbow: Normal.      Right wrist: She exhibits decreased range of motion and tenderness. She exhibits no bony tenderness, no swelling, no crepitus and no deformity.  Mild spasm noted in R trapezius. R shoulder diffusely TTP with no focal TTP or bony tenderness, no crepitus or deformity, and FROM intact. Negative apley scratch test, negative empty can test, negative int/ext rotation against resistance. ROM of R elbow intact, non-TTP, no erythema or swelling. ROM of R wrist limited secondary to pain, but passive ROM full and intact. R wrist TTP diffusely throughout wrist and forearm, no focal TTP or bony TTP. Negative tinel's sign, negative finkelstein's sign. Strength in b/l extremities 5/5.  No swelling, no erythema or edema of RUE.  Neurological: She is alert and oriented to person, place, and time. She has normal strength. No sensory deficit.  Sensation grossly intact along b/l UE's, specifically in axillary/radial/ulnar/median nerve distribution. Strength along same nerves 5/5 and intact bilaterally  Skin: Skin is warm, dry and intact. No  rash noted. No erythema.  No UE edema or erythema  Psychiatric: She has a normal mood and affect.    ED Course  Procedures (including critical care time) Labs Review Labs Reviewed - No data to display  Imaging Review No results found.   EKG Interpretation None      MDM   Final diagnoses:  Repetitive motion injury  Trapezius muscle spasm   Loretta Ellis is a 28 y.o. female with no significant PMHx who presents with R arm pain x 1 week. Exam demonstrating spasm in trapezius and diffuse TTP with no focal locations, neurovascularly intact, strength 5/5. No injury to the arm, but does repetitive motions. DDx includes repetitive motion injury, trapezius spasm, carpel tunnel, DVT/PE, CVA/ACS, rotator cuff injury, bursitis, radiculopathy, or other muscle strain. Due to benign nature of R arm exam, no alarm s/sx, PERC negative, and negative special testing in shoulder and wrist, I do not believe this is carpel tunnel, DVT/PE, CVA/ACS or acute fx/dislocation/bursitis/rotator cuff injury. No need for further imaging or work up at this time. I believe this is a repetitive motion injury and trapezius spasm related to her job. Advised continued use of wrist brace for comfort over  the next week, and allowing her arm to rest from her usual activities. D/C with flexeril for spasms and naprosyn for pain. I explained the diagnosis and have given explicit precautions to return to the ER including for any other new or worsening symptoms. The patient understands and accepts the medical plan as it's been dictated and I have answered their questions. Discharge instructions concerning home care and prescriptions have been given. The patient is STABLE and is discharged to home in good condition.  BP 132/80  Pulse 78  Temp(Src) 99.6 F (37.6 C) (Oral)  Resp 16  SpO2 100%    [place] Camprubi-Soms, PA-C 10/31/13 1652

## 2013-10-31 NOTE — Discharge Instructions (Signed)
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 Your pain should be treated with medicines such as ibuprofen or aleve and should get better over the next 2 weeks.  However if you develop severe or worsening pain, fever, numbness, or one-sided weakness, you should return to the ER immediately.  Please follow up with a primary care doctor this week for a recheck if you are still having symptoms. Use the resource guide below to find one.  The application of heat can help soothe the pain.  Perform gentle stretching as discussed. Drink plenty of fluids. Medications are also useful to help with pain control.  A commonly prescribed medications includes acetaminophen.  This medication is generally safe, though you should not take more than 6 of the extra strength (500mg ) pills a day. Non steroidal anti inflammatory medications including Ibuprofen and naproxen;  These medications help both pain and swelling and are very useful in treating back pain.  They should be taken with food, as they can cause stomach upset, and more seriously, stomach bleeding.  Muscle relaxants (Flexeril):  These medications can help with muscle tightness that is a cause of lower back pain.  Most of these medications can cause drowsiness, and it is not safe to drive or use dangerous machinery while taking them.  SEEK IMMEDIATE MEDICAL ATTENTION IF: New numbness, tingling, weakness, or problem with the use of your arms or legs.  Severe back pain not relieved with medications.  Difficulty with or loss of control of your bowel or bladder control.  Increasing pain in any areas of the body (such as chest or abdominal pain).  Shortness of breath, dizziness or fainting.  Nausea (feeling sick to your stomach), vomiting, fever, or sweats.  Repetitive Strain Injuries Repetitive strain injuries (RSIs) result from overuse or misuse of soft tissues including  muscles, tendons, or nerves. Tendons are the cord-like structures that attach muscles to bones. RSIs can affect almost any part of the body. However, RSIs are most common in the arms (thumbs, wrists, elbows, shoulders) and legs (ankles, knees). Common medical conditions that are often caused by repetitive strain include carpal tunnel syndrome, tennis or golfer's elbow, bursitis, and tendonitis. If RSIs are treated early, and therepeated activity is reduced or removed, the severity and length of your problems can usually be reduced. RSIs are also called cumulative trauma disorders (CTD).  CAUSES  Many RSIs occur due to repeating the same activity at work over weeks or months without sufficient rest, such as prolonged typing. RSIs also commonly occur when a hobby or sport is done repeatedly without sufficient rest. RSIs can also occur due to repeated strain or stress on a body part in someone who has one or more risk factors for RSIs. RISK FACTORS Workplace risk factors  Frequent computer use, especially if your workstation is not adjusted for your body type.  Infrequent rest breaks.  Working in a high-pressure environment.  Working at a [employer].  Repeating the same motion, such as frequent typing.  Working in an awkward position or holding the same position for a long time.  Forceful movements such as lifting, pulling, or pushing.  Vibration caused by using power tools.  Working in cold temperatures.  Job stress. Personal risk factors  Poor posture.  Being loose-jointed.  Not exercising regularly.  Being overweight.  Arthritis, diabetes, thyroid problems, or other long-term (chronic)medical conditions.  Vitamin deficiencies.  Keeping your fingernails long.  An unhealthy, stressful, or inactive lifestyle.  Not sleeping well. SYMPTOMS  Symptoms often begin at work but become more noticeable after the repeated stress has ended. For example, you may develop fatigue or  soreness in your wrist while typingat work, and at night you may develop numbness and tingling in your fingers. Common symptoms include:   Burning, shooting, or aching pain, especially in the fingers, palms, wrists, forearms, or shoulders.  Tenderness.  Swelling.  Tingling, numbness, or loss of feeling.  Pain with certain activities, such as turning a doorknob or reaching above your head.  Weakness, heaviness, or loss of coordination in yourhand.  Muscle spasms or tightness. In some cases, symptoms can become so intense that it is difficult to perform everyday tasks. Symptoms that do not improve with rest may indicate a more serious condition.  DIAGNOSIS  Your caregiver may determine the type ofRSI you have based on your medical evaluation and a description of your activities.  TREATMENT  Treatment depends on the severity and type of RSI you have. Your caregiver may recommend rest for the affected body part, medicines, and physical or occupational therapy to reduce pain, swelling, and soreness. Discuss the activities you do repeatedly with your caregiver. Your caregiver can help you decide whether you need to change your activities. An RSI may take months or years to heal, especially if the affected body part gets insufficient rest. In some cases, such as severe carpal tunnel syndrome, surgery may be recommended. PREVENTION  Talk with your supervisor to make sure you have the proper equipment for your work station.  Maintain good posture at your desk or work station with:  Feet flat on the floor.  Knees directly over the feet, bent at a right angle.  Lower back supported by your chair or a cushion in the curve of your lower back.  Shoulders and arms relaxed and at your sides.  Neck relaxed and not bent forwards or backwards.  Your desk and computer workstation properly adjusted to your body type.  Your chair adjusted so there is no excess pressure on the back of your  thighs.  The keyboard resting above your thighs. You should be able to reach the keys with your elbows at your side, bent at a right angle. Your arms should be supported on forearm rests, with your forearms parallel to the ground.  The computer mouse within easy reach.  The monitor directly in front of you, so that your eyes are aligned with the top of the screen. The screen should be about 15 to 25 inches from your eyes.  While typing, keep your wrist straight, in a neutral position. Move your entire arm when you move your mouse or when typing hard-to-reach keys.  Only use your computer as much as you need to for work. Do not use it during breaks.  Take breaks often from any repeated activity. Alternate with another task which requires you to use different muscles, or rest at least once every hour.  Change positions regularly. If you spend a lot of time sitting, get up, walk around, and stretch.  Do not hold pens or pencils tightly when writing.  Exercise regularly.  Maintain a normal weight.  Eat a diet with plenty of vegetables, whole grains, and fruit.  Get sufficient, restful sleep. HOME CARE INSTRUCTIONS  If your caregiver prescribed medicine to help reduce swelling, take it as directed.  Only take over-the-counter or prescription medicines for pain, discomfort, or  fever as directed by your caregiver.  Reduce, and if needed, stopthe activities that are causing your problems until you have no further symptoms.If your symptoms are work-related, you may need to talk to your supervisor about changing your activities.  When symptoms develop, put ice or a cold pack on the aching area.  Put ice in a plastic bag.  Place a towel between your skin and the bag.  Leave the ice on for 15-20 minutes.  If you were given a splint to keep your wrist from bending, wear it as instructed. It is important to wear the splint at night. Use the splint for as long as your caregiver  recommends. SEEK MEDICAL CARE IF:  You develop new problems.  Your problems do not get better with medicine. MAKE SURE YOU:  Understand these instructions.  Will watch your condition.  Will get help right away if you are not doing well or get worse. Document Released: 04/16/2002 Document Revised: 10/26/2011 Document Reviewed: 06/17/2011 [facility] Patient Information 2015 [place], [place]. This information is not intended to replace advice given to you by your health care provider. Make sure you discuss any questions you have with your health care provider.  Muscle Cramps and Spasms Muscle cramps and spasms are when muscles tighten by themselves. They usually get better within minutes. Muscle cramps are painful. They are usually stronger and last longer than muscle spasms. Muscle spasms may or may not be painful. They can last a few seconds or much longer. HOME CARE  Drink enough fluid to keep your pee (urine) clear or pale yellow.  Massage, stretch, and relax the muscle.  Use a warm towel, heating pad, or warm shower water on tight muscles.  Place ice on the muscle if it is tender or in pain.  Put ice in a plastic bag.  Place a towel between your skin and the bag.  Leave the ice on for 15-20 minutes, 03-04 times a day.  Only take medicine as told by your doctor. GET HELP RIGHT AWAY IF:  Your cramps or spasms get worse, happen more often, or do not get better with time. MAKE SURE YOU:  Understand these instructions.  Will watch your condition.  Will get help right away if you are not doing well or get worse. Document Released: 04/08/2008 Document Revised: 08/21/2012 Document Reviewed: 04/12/2012 [facility] Patient Information 2015 [place], [place]. This information is not intended to replace advice given to you by your health care provider. Make sure you discuss any questions you have with your health care provider.  Heat Therapy Heat therapy can help ease achy, tense,  stiff, and tight muscles and joints. Heat should not be used on new injuries. Wait at least 48 hours after the injury before using heat therapy. Heat also should not be used for discomfort or pain that occurs right after doing an activity. If you still have pain or stiffness 3 hours after finishing the activity, then heat therapy may be used. PRECAUTIONS  High heat or prolonged exposure to heat can cause burns. Be careful when using heat therapy to avoid burning your skin. If you have any of the following conditions, do not use heat until you have discussed heat therapy with your caregiver:  Poor circulation.  Healing wounds or scarred skin in the area being treated.  Diabetes, heart disease, or high blood pressure.  Numbness of the area being treated.  Unusual swelling of the area being treated.  Active infections.  Blood clots.  Cancer.  Inability to communicate your response to pain. This can include young children and people with dementia. HOME CARE INSTRUCTIONS Moist heat pack  Soak a clean towel in warm water, and squeeze out the extra water. The water temperature should be comfortable to the skin.  Put the warm, wet towel in a plastic bag.  Place a thin, dry towel between your skin and the bag.  Put the heat pack on the area for 5 minutes, and check your skin. Your skin may be pink, but it should not be red.  Leave the heat pack on the area for a total of 15 to 30 minutes.  Repeat this every 2 to 4 hours while awake. Do not use heat while you are sleeping. Warm water bath  Fill a tub with warm water. The water temperature should be comfortable to the skin.  Place the affected body part in the tub.  Soak the area for 20 to 40 minutes.  Repeat as needed. Hot water bottle  Fill the water bottle half full with hot water.  Press out the extra air. Close the cap tightly.  Place a dry towel between your skin and the bottle.  Put the bottle on the area for 5  minutes, and check your skin. Your skin may be pink, but it should not be red.  Leave the bottle on the area for a total of 15 to 30 minutes.  Repeat this every 2 to 4 hours while awake. Electric heating pad  Place a dry towel between your skin and the heating pad.  Set the heating pad on low heat.  Put the heating pad on the area for 10 minutes, and check your skin. Your skin may be pink, but it should not be red.  Leave the heating pad on the area for a total of 20 to 40 minutes.  Repeat this every 2 to 4 hours while awake.  Do not lie on the heating pad.  Do not fall asleep while using the heating pad.  Do not use the heating pad near water. Contact with water can result in an electrical shock. SEEK MEDICAL CARE IF:  You have blisters, redness, swelling, or numbness.  You have any new problems.  Your problems are getting worse.  You have any questions or concerns. If you develop any problems, stop using heat therapy until you see your caregiver. MAKE SURE YOU:  Understand these instructions.  Will watch your condition.  Will get help right away if you are not doing well or get worse. Document Released: 07/19/2011 Document Reviewed: 07/19/2011 [facility] Patient Information 2015 [place], [place]. This information is not intended to replace advice given to you by your health care provider. Make sure you discuss any questions you have with your health care provider.  Emergency Department Resource Guide 1) Find a Doctor and Pay Out of Pocket Although you won't have to find out who is covered by your insurance plan, it is a good idea to ask around and get recommendations. You will then need to call the office and see if the doctor you have chosen will accept you as a new patient and what types of options they offer for patients who are self-pay. Some doctors offer discounts or will set up payment plans for their patients who do not have insurance, but you will need to ask so  you aren't surprised when you get to your appointment.  2) Contact Your Local Health Department Not all health departments have doctors that  can see patients for sick visits, but many do, so it is worth a call to see if yours does. If you don't know where your local health department is, you can check in your phone book. The CDC also has a tool to help you locate your state's health department, and many state websites also have listings of all of their local health departments.  3) Find a Walk-in Clinic If your illness is not likely to be very severe or complicated, you may want to try a walk in clinic. These are popping up all over the country in pharmacies, drugstores, and shopping centers. They're usually staffed by nurse practitioners or physician assistants that have been trained to treat common illnesses and complaints. They're usually fairly quick and inexpensive. However, if you have serious medical issues or chronic medical problems, these are probably not your best option.  No Primary Care Doctor: - Call Health Connect at  [phone] - they can help you locate a primary care doctor that  accepts your insurance, provides certain services, etc. - Physician Referral Service- [phone]  Chronic Pain Problems: Organization         Address  Phone   Notes  [facility]  [phone] Patients need to be referred by their primary care doctor.   Medication Assistance: Organization         Address  Phone   Notes  [facility] Medication [facility] [address] [place] [place], [place] [address] [phone] --Must be a resident of [facility] -- Must have NO insurance coverage whatsoever (no Medicaid/ Medicare, etc.) -- The pt. MUST have a primary care doctor that directs their care regularly and follows them in the community   [facility]  [phone]   [employer]  [phone]    Agencies that provide inexpensive medical  care: Organization         Address  Phone   Notes  [facility]  [phone]   [facility]    [phone]   [facility] [address] [place], [place] [address] [phone]   [facility] [address] [place] [place] [phone]   Planned Parenthood    [phone]   [facility]    [phone]   [facility]  [address], [place] Phone:  [phone], Fax:  [phone] Hours of Operation:  9 am - 6 pm, M-F.  Also accepts Medicaid/Medicare and self-pay.  [facility]  [address], [place] Phone: [phone], Fax: [phone]. Hours of Operation:  8:30 am - 5:30 pm, M-F.  Also accepts Medicaid and self-pay.  [facility] [place] [address], [place] Phone: [phone]   [facility] [address] Natasha Bence [place], [place] [phone], Ext. [phone] Mondays & Thursdays: 7-9 AM.  First 15 patients are seen on a first come, first serve basis.    Medicaid-accepting [facility] Providers:  Organization         Address  Phone   Notes  [facility] [address],  [phone] Also accepts self-pay patients.  [facility] [address] Laurell Josephs [place], [place]  [phone]   [facility] [address], [place] [phone]   [facility] [address] [place] [address],  [place] [phone]   Renaye Rakers [address], [place]   [phone] Only accepts [place] Access [place] patients after they have their name applied to their card.   Self-Pay (no insurance) in [facility]:  Organization         Address  Phone   Notes  Sickle Cell Patients, [facility] [address] [place], [place] [phone]   [facility] [address] [place],  [place] [phone]   [facility] [place]  [address], [place] [phone]   [facility]/Dr. Osei-Bonsu  [address], [place] or [address], [place] [phone] Phone number for both [place] and [place] locations is the same.  [facility] [address], [place] [phone]   [facility] [address], [place] or [address] [phone]   [facility] [address], [place] [phone], phone; [phone], fax Sees patients 1st and 3rd Saturday of every month.  Must not qualify for public or private insurance (i.e. Medicaid, Medicare, [place] Health Choice, Veterans' Benefits)  Household income should be no more than 200% of the poverty level The clinic cannot treat you if you are pregnant or think you are pregnant  Sexually transmitted diseases are not treated at the clinic.    Dental Care: Organization         Address  Phone  Notes  [facility] Department of [facility] [address] [place], [place] [phone] Accepts children up to age 20 who are enrolled in [place] or [place] Health Choice; pregnant women with a Medicaid card; and children who have applied for Medicaid or [place] Health Choice, but were declined, whose parents can pay a reduced fee at time of service.  [facility] Department of [facility]  [address], [place] [phone] Accepts children up to age 38 who are enrolled in [place] or [place] Health Choice; pregnant women with a Medicaid card; and children who have applied for Medicaid or [place] Health Choice, but were declined, whose parents can pay a reduced fee at time of service.  [facility]  [address] [place], [place] [phone] Patients are seen by appointment only. Walk-ins are not accepted. [facility] will see patients 16 years of age and older. Monday - Tuesday (8am-5pm) Most Wednesdays (8:30-5pm) $30 per visit, cash only  [facility] Adult [facility]  [address], [facility] [phone] Patients are seen by appointment only. Walk-ins are not accepted. [facility] will see patients 27 years of age and older. One Wednesday Evening (Monthly: Volunteer Based).  $30 per visit, cash only  [employer]  [phone] for adults; Children under age 2, call Graduate Pediatric Dentistry at [phone]. Children aged 64-14, please call [phone] to request a pediatric application.  Dental services are provided in all areas of dental care including fillings, crowns and bridges, complete and partial dentures, implants, gum treatment, root canals, and extractions. Preventive care is also provided. Treatment is provided to both adults and children. Patients are selected via a lottery and there is often a waiting list.   [facility] [address] [place]  [phone] [email or web address]   [facility] [address],  [place], [place] [phone], Ext. [phone] Second and Fourth Thursday of each month, opens at 6:30 AM; Clinic ends at 9 AM.  Patients are seen on a first-come first-served basis, and a limited number are seen during each clinic.   [facility]  [address] Ether Griffins [place], [place] [phone]   Eligibility Requirements You must have lived in [place], [place], or [place] counties for at least the last three months.   You cannot be eligible for state or federal sponsored [place], including [employer], [place], or [employer].   You generally cannot be eligible for healthcare insurance through your employer.    How to apply: Eligibility screenings are held every Tuesday and Wednesday afternoon from 1:00 pm until 4:00 pm. You do not need an appointment for the interview!   [facility] [address] [place], [place] [phone]   [facility]  [phone]   [facility] Health Department  [phone]   [facility] Health Department  [phone]    Behavioral Health Resources in the Community: Intensive Outpatient Programs Organization         Address  Phone  Notes  [facility] [address] [place], [place] [phone]   [facility] [address] [place], [place] [phone]   [facility] [address] [place], [place]  [phone]   [facility] [address]  [place], [place] [phone] or [phone]   Substance Abuse Resources Organization         Address  Phone  Notes  [facility]  [phone]   [facility]  [phone]   [facility]  [phone]   Floydene Flock  [phone]   Residential & Outpatient Substance Abuse Program  [phone]   Psychological Services Organization         Address  Phone  Notes  [facility]  [phone]   [facility]  [phone]   [facility] [address] [place], [place] [phone] or [phone]    Mobile Crisis Teams Organization         Address  Phone  Notes  [facility], Mobile Crisis Care Unit  [phone]   [facility]  [address] [place], [place] [phone]   Doristine Locks [address] [place] [phone]    Self-Help/Support Groups Organization         Address  Phone             Notes  [facility] [place] - variety of support groups  [phone] Call for more information  Narcotics Anonymous (NA), [facility] [address], [employer] [place]  2 meetings at this location   Statistician         Address  Phone  Notes  [facility] [address],    [place]  [phone]   [facility]  [address], [place] [address], [place], [place] [phone]   [facility] [address] [place], [place] [phone] Admissions: 8am-3pm M-F  [facility] [address]    [place], [place] [phone]   [facility] [address] [place], [place] [phone]   [facility] [address],  [place], [place] [phone]   [facility] Intensive Outpatient [address], [place], [place] [phone]  [facility] [address] [place]  [place], [place] [phone] or [phone]   [facility] [address] [place], [place] [phone] Accepts Medicaid  [facility] [address],  [place] [phone] Substance Abuse/Addiction Treatment   [facility]         Address  Phone  Notes  [facility]  [phone]   Angie Fava, PhD [address] Ervin Knack [place], [place]   [phone] or [phone]   [facility]   [address] [place], [place] [phone]   [facility] [address] [place], [place] [phone] Insurance/Medicaid/sponsorship through [facility] and Families [address]                                    [place], [place] [phone] Therapy/tele-psych/case  [facility] [address] [place], [place] [phone]    Dr. Lolly Mustache  [phone]   [facility]  [employer] [facility] Dept. 1) [address] [place] 2) [address], [place] 3)  [address], [place] [phone]  [phone]   [facility] Child Abuse Hotline [phone] or [phone] (After Hours)

## 2013-10-31 NOTE — ED Notes (Signed)
Pt presents to department for evaluation of R wrist pain radiating up entire arm to shoulder. Ongoing x1 week. States no relief from pain. Denies recent injury. Able to wiggle digits. Pulses present to R arm. No signs of distress noted.

## 2013-11-02 NOTE — ED Provider Notes (Signed)
Medical screening examination/treatment/procedure(s) were performed by non-physician practitioner and as supervising physician I was immediately available for consultation/collaboration.   EKG Interpretation None        Kathleen M McManus, DO 11/02/13 0922 

## 2014-03-11 ENCOUNTER — Encounter (HOSPITAL_COMMUNITY): Payer: Self-pay | Admitting: Emergency Medicine

## 2015-03-25 ENCOUNTER — Encounter: Payer: Self-pay | Admitting: Emergency Medicine

## 2015-03-25 ENCOUNTER — Emergency Department
Admission: EM | Admit: 2015-03-25 | Discharge: 2015-03-25 | Disposition: A | Discharge status: Home / Self Care | Payer: Medicaid Other | Attending: Emergency Medicine | Admitting: Emergency Medicine

## 2015-03-25 DIAGNOSIS — Z791 Long term (current) use of non-steroidal anti-inflammatories (NSAID): Secondary | ICD-10-CM | POA: Diagnosis not present

## 2015-03-25 DIAGNOSIS — R51 Headache: Secondary | ICD-10-CM | POA: Diagnosis not present

## 2015-03-25 DIAGNOSIS — R519 Headache, unspecified: Secondary | ICD-10-CM

## 2015-03-25 MED ORDER — PROMETHAZINE HCL 25 MG PO TABS
ORAL_TABLET | ORAL | Status: AC
  Administered 2015-03-25: 12.5 mg via ORAL
  Filled 2015-03-25: qty 1

## 2015-03-25 MED ORDER — DIPHENHYDRAMINE HCL 25 MG PO CAPS
25.0000 mg | ORAL_CAPSULE | Freq: Once | ORAL | Status: AC
  Administered 2015-03-25: 25 mg via ORAL

## 2015-03-25 MED ORDER — PROMETHAZINE HCL 25 MG PO TABS
12.5000 mg | ORAL_TABLET | Freq: Once | ORAL | Status: AC
  Administered 2015-03-25: 12.5 mg via ORAL

## 2015-03-25 MED ORDER — DIPHENHYDRAMINE HCL 25 MG PO CAPS
ORAL_CAPSULE | ORAL | Status: AC
  Administered 2015-03-25: 25 mg via ORAL
  Filled 2015-03-25: qty 1

## 2015-03-25 MED ORDER — IBUPROFEN 800 MG PO TABS
800.0000 mg | ORAL_TABLET | Freq: Once | ORAL | Status: AC
  Administered 2015-03-25: 800 mg via ORAL

## 2015-03-25 MED ORDER — IBUPROFEN 800 MG PO TABS
ORAL_TABLET | ORAL | Status: AC
  Administered 2015-03-25: 800 mg via ORAL
  Filled 2015-03-25: qty 1

## 2015-03-25 NOTE — ED Provider Notes (Signed)
[facility] Emergency Department Provider Note   ____________________________________________  Time seen: 5:15 PM I have reviewed the triage vital signs and the triage nursing note.  HISTORY  Chief Complaint Headache   Historian Patient and significant other  HPI Loretta Ellis is a 29 y.o. female who is here for evaluation of headache. This specific headache started last evening was gradual onset and located mostly in the frontal area. No vision changes. No fever. No recent congestion. No known exacerbating or alleviating factors. Patient states she has had headaches in the past over the past 6 months which have been intermittent and usually respond Vicoprofen. She tried a Motrin yesterday. She did not try anything today. 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 No seizure, altered mental status, weakness, numbness, or confusion.    Past Medical History  Diagnosis Date  . Gonorrhea   . Chlamydia   . No pertinent past medical history     There are no active problems to display for this patient.   Past Surgical History  Procedure Laterality Date  . Tonsillectomy    . Ear tube removal    . Adenoidectomy      Current Outpatient Rx  Name  Route  Sig  Dispense  Refill  . cyclobenzaprine (FLEXERIL) 10 MG tablet   Oral   Take 1 tablet (10 mg total) by mouth 2 (two) times daily as needed for muscle spasms.   20 tablet   0   . naproxen (NAPROSYN) 500 MG tablet   Oral   Take 1 tablet (500 mg total) by mouth 2 (two) times daily.   20 tablet   0     Allergies Review of patient's allergies indicates no known allergies.  Family History  Problem Relation Age of Onset  . Anesthesia problems Neg Hx   . Hypotension Neg Hx   . Malignant hyperthermia Neg Hx   . Pseudochol deficiency Neg Hx     Social History Social History  Substance Use Topics  . Smoking status:  Never Smoker   . Smokeless tobacco: Never Used  . Alcohol Use: No    Review of Systems  Constitutional: Negative for fever. Eyes: Negative for visual changes. ENT: Negative for sore throat. Cardiovascular: Negative for chest pain. Respiratory: Negative for shortness of breath. Gastrointestinal: Negative for abdominal pain, vomiting and diarrhea. Genitourinary: Negative for dysuria. Musculoskeletal: Negative for back pain. Skin: Negative for rash. Neurological: Positive per history of present illness. 10 point Review of Systems otherwise negative ____________________________________________   PHYSICAL EXAM:  VITAL SIGNS: ED Triage Vitals  Enc Vitals Group     BP 03/25/15 1433 141/82 mmHg     Pulse Rate 03/25/15 1433 94     Resp 03/25/15 1433 20     Temp 03/25/15 1433 98.7 F (37.1 C)     Temp Source 03/25/15 1433 Oral     SpO2 03/25/15 1433 100 %     Weight 03/25/15 1433 180 lb (81.647 kg)     Height 03/25/15 1433 5\' 8"  (1.727 m)     Head Cir --      Peak Flow --      Pain Score 03/25/15 1433 8     Pain Loc --      Pain Edu? --      Excl. in GC? --      Constitutional: Alert and oriented. Well appearing and in no distress. Eyes:  Conjunctivae are normal. PERRL. funduscopic exam normal. Normal extraocular movements. ENT   Head: Normocephalic and atraumatic. Mild tenderness over the frontal sinus area.   Nose: No congestion/rhinnorhea.   Mouth/Throat: Mucous membranes are moist. No pharyngeal erythema.   Neck: No stridor. No neck lymphadenopathy. No neck stiffness. Cardiovascular/Chest: Normal rate, regular rhythm.  No murmurs, rubs, or gallops. Respiratory: Normal respiratory effort without tachypnea nor retractions. Breath sounds are clear and equal bilaterally. No wheezes/rales/rhonchi. Gastrointestinal: Soft. No distention, no guarding, no rebound. Nontender   Genitourinary/rectal:Deferred Musculoskeletal: Nontender with normal range of motion in all  extremities. No joint effusions.  No lower extremity tenderness.  No edema. Neurologic:  Normal speech and language. No gross or focal neurologic deficits are appreciated. Skin:  Skin is warm, dry and intact. No rash noted. Psychiatric: Mood and affect are normal. Speech and behavior are normal. Patient exhibits appropriate insight and judgment.  ____________________________________________   EKG I, Governor Rooks, MD, the attending physician have personally viewed and interpreted all ECGs.  None ____________________________________________  LABS (pertinent positives/negatives)  None  ____________________________________________  RADIOLOGY All Xrays were viewed by me. Imaging interpreted by Radiologist.  None __________________________________________  PROCEDURES  Procedure(s) performed: None  Critical Care performed: None  ____________________________________________   ED COURSE / ASSESSMENT AND PLAN  CONSULTATIONS: None  Pertinent labs & imaging results that were available during my care of the patient were reviewed by me and considered in my medical decision making (see chart for details).   The patient is here with a nonspecific and gradual onset headache with a normal physical exam. There are no high-risk features on history or exam to make me concerned about emergency cause of headache. No indication for head imaging at this point time. I discussed this with the patient and she agrees with the plan. I discussed IV versus by mouth medication treatment for the headache, and patient chose to take pills by mouth. We discussed follow-up with the primary care physician for which she was referred, as well as the possibility in the future of neurology referral and possibly MRI the brain for frequent/recurrent headaches.  Patient / Family / Caregiver informed of clinical course, medical decision-making process, and agree with plan.   I discussed return precautions, follow-up  instructions, and discharged instructions with patient and/or family.  ___________________________________________   FINAL CLINICAL IMPRESSION(S) / ED DIAGNOSES   Final diagnoses:  Nonintractable headache       Governor Rooks, MD 03/25/15 [phone]

## 2015-03-25 NOTE — ED Notes (Signed)
Spoke with Dr. Langston MaskerShaevitz regarding pt, no orders received at this time.

## 2015-03-25 NOTE — Discharge Instructions (Signed)
You were evaluated for headache, and although no certain cause was found, I suspect tension or migraine. Your exam and evaluation are reassuring here in the emergency department.  At onset of headache, you may take ibuprofen 800 mg, and over-the-counter Benadryl 25 mg tablet, and drink an 8 ounce glass of water. Getting plenty of sleep. Avoid any fumes that seem to be bothering you.  If you're having some sinus congestion, you may try an over-the-counter allergy medication such as Zyrtec, or a few days of Sudafed.  Return to the emergency department for any worsening condition including any worsening headache, vision changes or problems, neck stiffness, fever, weakness, numbness, confusion or altered mental status, seizure, or any other symptoms concerning to you.  We discussed follow-up with primary care physician, and you are referred to the [facility]. We discussed the possibility of future imaging such as MRI, and possible referral to neurology for frequent headaches.   General Headache Without Cause A headache is pain or discomfort felt around the head or neck area. There are many causes and types of headaches. In some cases, the cause may not be found.  HOME CARE  Managing Pain  Take over-the-counter and prescription medicines only as told by your doctor.  Lie down in a dark, quiet room when you have a headache.  If directed, apply ice to the head and neck area:  Put ice in a plastic bag.  Place a towel between your skin and the bag.  Leave the ice on for 20 minutes, 2-3 times per day.  Use a heating pad or hot shower to apply heat to the head and neck area as told by your doctor.  Keep lights dim if bright lights bother you or make your headaches worse. Eating and Drinking  Eat meals on a regular schedule.  Lessen how much alcohol you drink.  Lessen how much caffeine you drink, or stop drinking caffeine. General Instructions  Keep all follow-up visits as told by  your doctor. This is important.  Keep a journal to find out if certain things bring on headaches. For example, write down:  What you eat and drink.  How much sleep you get.  Any change to your diet or medicines.  Relax by getting a massage or doing other relaxing activities.  Lessen stress.  Sit up straight. Do not tighten (tense) your muscles.  Do not use tobacco products. This includes cigarettes, chewing tobacco, or e-cigarettes. If you need help quitting, ask your doctor.  Exercise regularly as told by your doctor.  Get enough sleep. This often means 7-9 hours of sleep. GET HELP IF:  Your symptoms are not helped by medicine.  You have a headache that feels different than the other headaches.  You feel sick to your stomach (nauseous) or you throw up (vomit).  You have a fever. GET HELP RIGHT AWAY IF:   Your headache becomes really bad.  You keep throwing up.  You have a stiff neck.  You have trouble seeing.  You have trouble speaking.  You have pain in the eye or ear.  Your muscles are weak or you lose muscle control.  You lose your balance or have trouble walking.  You feel like you will pass out (faint) or you pass out.  You have confusion.   This information is not intended to replace advice given to you by your health care provider. Make sure you discuss any questions you have with your health care provider.   Document  Released: 02/03/2008 Document Revised: 01/15/2015 Document Reviewed: 08/19/2014 Elsevier Interactive Patient Education [employer]2016 Elsevier Inc.

## 2015-03-25 NOTE — ED Notes (Signed)
Pt to ed with c/o headache intermittently x 6 months.  Pt states when she touches her forehead the pain is increased.

## 2015-05-11 NOTE — L&D Delivery Note (Signed)
Delivery Note At 11:15 PM a viable female was delivered via Vaginal, Spontaneous Delivery (Presentation: vtx; ROA ).  APGAR: 8, 9; weight pending.   Placenta status: spontaneous, intact.  Cord:  with the following complications: none.   Anesthesia:  Epidural Episiotomy: None Lacerations: None Suture Repair: none Est. Blood Loss (mL): 200  Mom to postpartum.  Baby to Couplet care / Skin to Skin.  Trude Cansler D 02/10/2016, 11:29 PM

## 2015-06-24 ENCOUNTER — Emergency Department
Admission: EM | Admit: 2015-06-24 | Discharge: 2015-06-24 | Disposition: A | Discharge status: Home / Self Care | Payer: Medicaid Other | Attending: Emergency Medicine | Admitting: Emergency Medicine

## 2015-06-24 ENCOUNTER — Encounter: Payer: Self-pay | Admitting: Emergency Medicine

## 2015-06-24 DIAGNOSIS — Z791 Long term (current) use of non-steroidal anti-inflammatories (NSAID): Secondary | ICD-10-CM | POA: Diagnosis not present

## 2015-06-24 DIAGNOSIS — M545 Low back pain, unspecified: Secondary | ICD-10-CM

## 2015-06-24 LAB — URINALYSIS COMPLETE WITH MICROSCOPIC (ARMC ONLY)
BILIRUBIN URINE: NEGATIVE
Bacteria, UA: NONE SEEN
GLUCOSE, UA: NEGATIVE mg/dL
Hgb urine dipstick: NEGATIVE
Ketones, ur: NEGATIVE mg/dL
Leukocytes, UA: NEGATIVE
NITRITE: NEGATIVE
Protein, ur: NEGATIVE mg/dL
SPECIFIC GRAVITY, URINE: 1.03 (ref 1.005–1.030)
pH: 5 (ref 5.0–8.0)

## 2015-06-24 MED ORDER — CYCLOBENZAPRINE HCL 10 MG PO TABS: 10.0000 mg | ORAL_TABLET | Freq: Three times a day (TID) | ORAL | Status: DC | PRN

## 2015-06-24 MED ORDER — CYCLOBENZAPRINE HCL 10 MG PO TABS
10.0000 mg | ORAL_TABLET | Freq: Once | ORAL | Status: AC
  Administered 2015-06-24: 10 mg via ORAL
  Filled 2015-06-24: qty 1

## 2015-06-24 NOTE — Discharge Instructions (Signed)

## 2015-06-24 NOTE — ED Provider Notes (Signed)
[facility] Emergency Department Provider Note  ____________________________________________  Time seen: 4:45 AM  I have reviewed the triage vital signs and the nursing notes.   HISTORY  Chief Complaint Back Pain      HPI Loretta Ellis is a 30 y.o. female presents with nontraumatic low back pain 3 days. Patient states that the pain is worse with movement and bending over. Patient states pain is provoked secondary to standing for long periods of time at work. Of note patient is pregnant, she was pregnant in January. Patient denies any vaginal bleeding no pelvic pain no nausea or vomiting. Patient denies any urinary symptoms. Patient states that her back pain precedes her pregnancy. Current pain score 7 out of 10. Unrelieved with home Tylenol     Past Medical History  Diagnosis Date  . Gonorrhea   . Chlamydia   . No pertinent past medical history     There are no active problems to display for this patient.   Past Surgical History  Procedure Laterality Date  . Tonsillectomy    . Ear tube removal    . Adenoidectomy      Current Outpatient Rx  Name  Route  Sig  Dispense  Refill  . cyclobenzaprine (FLEXERIL) 10 MG tablet   Oral   Take 1 tablet (10 mg total) by mouth 2 (two) times daily as needed for muscle spasms.   20 tablet   0   . naproxen (NAPROSYN) 500 MG tablet   Oral   Take 1 tablet (500 mg total) by mouth 2 (two) times daily.   20 tablet   0     Allergies No known drug allergies  Family History  Problem Relation Age of Onset  . Anesthesia problems Neg Hx   . Hypotension Neg Hx   . Malignant hyperthermia Neg Hx   . Pseudochol deficiency Neg Hx     Social History Social History  Substance Use Topics  . Smoking status: Never Smoker   . Smokeless tobacco: Never Used  . Alcohol Use: No    Review of Systems  Constitutional: Negative for fever. Eyes: Negative for visual changes. ENT: Negative for sore  throat. Cardiovascular: Negative for chest pain. Respiratory: Negative for shortness of breath. Gastrointestinal: Negative for abdominal pain, vomiting and diarrhea. Genitourinary: Negative for dysuria. Musculoskeletal: Negative for back pain. Skin: Negative for rash. Neurological: Negative for headaches, focal weakness or numbness.   10-point ROS otherwise negative.  ____________________________________________   PHYSICAL EXAM:  VITAL SIGNS: ED Triage Vitals  Enc Vitals Group     BP 06/24/15 0411 136/76 mmHg     Pulse Rate 06/24/15 0411 87     Resp 06/24/15 0411 18     Temp 06/24/15 0411 98.6 F (37 C)     Temp Source 06/24/15 0411 Oral     SpO2 06/24/15 0411 96 %     Weight 06/24/15 0411 185 lb (83.915 kg)     Height 06/24/15 0411  (1.753 m)     Head Cir --      Peak Flow --      Pain Score 06/24/15 0412 7     Pain Loc --      Pain Edu? --      Excl. in GC? --      Constitutional: Alert and oriented. Well appearing and in no distress. Eyes: Conjunctivae are normal. PERRL. Normal extraocular movements. ENT   Head: Normocephalic and atraumatic.   Nose: No congestion/rhinnorhea.  Mouth/Throat: Mucous membranes are moist.   Neck: No stridor. Hematological/Lymphatic/Immunilogical: No cervical lymphadenopathy. Cardiovascular: Normal rate, regular rhythm. Normal and symmetric distal pulses are present in all extremities. No murmurs, rubs, or gallops. Respiratory: Normal respiratory effort without tachypnea nor retractions. Breath sounds are clear and equal bilaterally. No wheezes/rales/rhonchi. Gastrointestinal: Soft and nontender. No distention. There is no CVA tenderness. Genitourinary: deferred Musculoskeletal: Nontender with normal range of motion in all extremities. No joint effusions.  No lower extremity tenderness nor edema. Lumbar paraspinal muscle tenderness to palpation Neurologic:  Normal speech and language. No gross focal neurologic  deficits are appreciated. Speech is normal.  Skin:  Skin is warm, dry and intact. No rash noted. Psychiatric: Mood and affect are normal. Speech and behavior are normal. Patient exhibits appropriate insight and judgment.     INITIAL IMPRESSION / ASSESSMENT AND PLAN / ED COURSE  Pertinent labs & imaging results that were available during my care of the patient were reviewed by me and considered in my medical decision making (see chart for details).  Patient given Flexeril  orally given  ____________________________________________   FINAL CLINICAL IMPRESSION(S) / ED DIAGNOSES  Final diagnoses:  Bilateral low back pain without sciatica      Darci Current, MD 06/24/15 [phone]

## 2015-06-24 NOTE — ED Notes (Signed)
Patient with no complaints at this time. Respirations even and unlabored. Skin warm/dry. Discharge instructions reviewed with patient at this time. Patient given opportunity to voice concerns/ask questions. Patient discharged at this time and left Emergency Department with steady gait.   

## 2015-06-24 NOTE — ED Notes (Signed)
Pt presents to ED with lower back pain with no known injury. Pt states she stands a lot at work and does have a hx of back pain. Pt denies vaginal bleeding or urinary symptoms at this time. denies any other symptoms. Pt states she found out she was pregnant the end of January and has made her first appt at [facility].

## 2015-07-29 LAB — OB RESULTS CONSOLE GC/CHLAMYDIA
CHLAMYDIA, DNA PROBE: NEGATIVE
GC PROBE AMP, GENITAL: NEGATIVE

## 2015-07-29 LAB — OB RESULTS CONSOLE HEPATITIS B SURFACE ANTIGEN: Hepatitis B Surface Ag: NEGATIVE

## 2015-07-29 LAB — OB RESULTS CONSOLE ABO/RH: RH Type: POSITIVE

## 2015-07-29 LAB — OB RESULTS CONSOLE RPR: RPR: NONREACTIVE

## 2015-07-29 LAB — OB RESULTS CONSOLE HIV ANTIBODY (ROUTINE TESTING): HIV: NONREACTIVE

## 2015-07-29 LAB — OB RESULTS CONSOLE ANTIBODY SCREEN: Antibody Screen: NEGATIVE

## 2015-07-29 LAB — OB RESULTS CONSOLE RUBELLA ANTIBODY, IGM: RUBELLA: IMMUNE

## 2015-08-14 ENCOUNTER — Inpatient Hospital Stay (HOSPITAL_COMMUNITY)
Admission: AD | Admit: 2015-08-14 | Payer: Medicaid Other | Source: Ambulatory Visit | Admitting: Obstetrics and Gynecology

## 2015-11-23 ENCOUNTER — Encounter (HOSPITAL_COMMUNITY): Payer: Self-pay | Admitting: Emergency Medicine

## 2015-11-23 ENCOUNTER — Emergency Department (HOSPITAL_BASED_OUTPATIENT_CLINIC_OR_DEPARTMENT_OTHER)
Admit: 2015-11-23 | Discharge: 2015-11-23 | Disposition: A | Discharge status: Home / Self Care | Payer: Medicaid Other | Attending: Emergency Medicine | Admitting: Emergency Medicine

## 2015-11-23 ENCOUNTER — Emergency Department (HOSPITAL_COMMUNITY)
Admission: EM | Admit: 2015-11-23 | Discharge: 2015-11-23 | Disposition: A | Discharge status: Home / Self Care | Payer: Medicaid Other | Attending: Emergency Medicine | Admitting: Emergency Medicine

## 2015-11-23 DIAGNOSIS — O26893 Other specified pregnancy related conditions, third trimester: Secondary | ICD-10-CM | POA: Diagnosis not present

## 2015-11-23 DIAGNOSIS — Z3A28 28 weeks gestation of pregnancy: Secondary | ICD-10-CM | POA: Diagnosis not present

## 2015-11-23 DIAGNOSIS — M79609 Pain in unspecified limb: Secondary | ICD-10-CM | POA: Diagnosis not present

## 2015-11-23 DIAGNOSIS — I10 Essential (primary) hypertension: Secondary | ICD-10-CM | POA: Insufficient documentation

## 2015-11-23 DIAGNOSIS — M791 Myalgia: Secondary | ICD-10-CM | POA: Insufficient documentation

## 2015-11-23 DIAGNOSIS — M79604 Pain in right leg: Secondary | ICD-10-CM | POA: Diagnosis present

## 2015-11-23 DIAGNOSIS — M7918 Myalgia, other site: Secondary | ICD-10-CM

## 2015-11-23 MED ORDER — ACETAMINOPHEN 325 MG PO TABS
650.0000 mg | ORAL_TABLET | Freq: Once | ORAL | Status: AC
  Administered 2015-11-23: 650 mg via ORAL
  Filled 2015-11-23: qty 2

## 2015-11-23 NOTE — ED Provider Notes (Signed)
CSN: [id number]     Arrival date & time 11/23/15  0806 History   First MD Initiated Contact with Patient 11/23/15 [phone]     Chief Complaint  Patient presents with  . Leg Pain     (Consider location/radiation/quality/duration/timing/severity/associated sxs/prior Treatment) HPI Comments: 30 year old female complains of two-week history of right thigh pain as worse with standing. Denies any swelling to the extremity. 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 Denies any chest pain or shortness of breath. No prior history of PE. She is [redacted] weeks pregnant. Does have a history of hypertension during this pregnancy. Pain is better with rest. Has seen her Dr. for similar symptoms and was told to sleep in different position. Denies any prior history of DVT  Patient is a 30 y.o. female presenting with leg pain. The history is provided by the patient.  Leg Pain   Past Medical History  Diagnosis Date  . Gonorrhea   . Chlamydia   . No pertinent past medical history    Past Surgical History  Procedure Laterality Date  . Tonsillectomy    . Ear tube removal    . Adenoidectomy     Family History  Problem Relation Age of Onset  . Anesthesia problems Neg Hx   . Hypotension Neg Hx   . Malignant hyperthermia Neg Hx   . Pseudochol deficiency Neg Hx    Social History  Substance Use Topics  . Smoking status: Never Smoker   . Smokeless tobacco: Never Used  . Alcohol Use: No   OB History    Gravida Para Term Preterm AB TAB SAB Ectopic Multiple Living   2 1 1       1      Review of Systems  All other systems reviewed and are negative.     Allergies  Review of patient's allergies indicates no known allergies.  Home Medications   Prior to Admission medications   Medication Sig Start Date End Date Taking? Authorizing Provider  cyclobenzaprine (FLEXERIL) 10 MG tablet Take 1 tablet (10 mg total) by mouth 2 (two) times daily as needed for muscle spasms. 10/31/13    Mercedes Camprubi-Soms, PA-C  cyclobenzaprine (FLEXERIL) 10 MG tablet Take 1 tablet (10 mg total) by mouth 3 (three) times daily as needed for muscle spasms. 06/24/15   Darci Currentandolph N Brown, MD  naproxen (NAPROSYN) 500 MG tablet Take 1 tablet (500 mg total) by mouth 2 (two) times daily. 10/31/13   Mercedes Camprubi-Soms, PA-C   BP 113/103 mmHg  Pulse 88  Temp(Src) 98.1 F (36.7 C) (Oral)  Resp 18  SpO2 100%  LMP 05/02/2015 Physical Exam  Constitutional: She is oriented to person, place, and time. She appears well-developed and well-nourished.  Non-toxic appearance. No distress.  HENT:  Head: Normocephalic and atraumatic.  Eyes: Conjunctivae, EOM and lids are normal. Pupils are equal, round, and reactive to light.  Neck: Normal range of motion. Neck supple. No tracheal deviation present. No thyroid mass present.  Cardiovascular: Normal rate, regular rhythm and normal heart sounds.  Exam reveals no gallop.   No murmur heard. Pulmonary/Chest: Effort normal and breath sounds normal. No stridor. No respiratory distress. She has no decreased breath sounds. She has no wheezes. She has no rhonchi. She has no rales.  Abdominal: Soft. Normal appearance and bowel sounds are normal. She exhibits no distension. There is no tenderness. There is no rebound and no CVA tenderness.  Musculoskeletal: Normal range of motion. She exhibits no edema.  Right upper leg: She exhibits tenderness.       Legs: Neurological: She is alert and oriented to person, place, and time. She has normal strength. No cranial nerve deficit or sensory deficit. GCS eye subscore is 4. GCS verbal subscore is 5. GCS motor subscore is 6.  Skin: Skin is warm and dry. No abrasion and no rash noted.  Psychiatric: She has a normal mood and affect. Her speech is normal and behavior is normal.  Nursing note and vitals reviewed.   ED Course  Procedures (including critical care time) Labs Review Labs Reviewed - No data to display  Imaging  Review No results found. I have personally reviewed and evaluated these images and lab results as part of my medical decision-making.   EKG Interpretation None      MDM   Final diagnoses:  None  Lower extremity Doppler was negative for DVT. Likely musculoskeletal pain stable for discharge    Lorre Nick, MD 11/23/15 1137

## 2015-11-23 NOTE — ED Notes (Addendum)
Pt is currently 28 w/ pregnant. States over the last two weeks she's had increasing right leg pain that starts in her hip/top of leg and radiates down through entire leg. Works standing for most of day. Denies any injury, no obvious swelling, pulse movement sensation intact in both feet. Describes it as a throbbing pain

## 2015-11-23 NOTE — Progress Notes (Signed)
*  PRELIMINARY RESULTS* Vascular Ultrasound Right lower extremity venous duplex has been completed.  Preliminary findings: No evidence of DVT or baker's cyst.   Farrel DemarkJill Eunice, RDMS, RVT  11/23/2015, 10:50 AM

## 2015-11-23 NOTE — ED Notes (Signed)
Ultrasound at bedside

## 2015-11-23 NOTE — Discharge Instructions (Signed)
Use Tylenol as needed for pain Musculoskeletal Pain Musculoskeletal pain is muscle and boney aches and pains. These pains can occur in any part of the body. Your caregiver may treat you without knowing the cause of the pain. They may treat you if blood or urine tests, X-rays, and other tests were normal.  CAUSES There is often not a definite cause or reason for these pains. These pains may be caused by a type of germ (virus). The discomfort may also come from overuse. Overuse includes working out too hard when your body is not fit. Boney aches also come from weather changes. Bone is sensitive to atmospheric pressure changes. HOME CARE INSTRUCTIONS   Ask when your test results will be ready. Make sure you get your test results.  Only take over-the-counter or prescription medicines for pain, discomfort, or fever as directed by your caregiver. If you were given medications for your condition, do not drive, operate machinery or power tools, or sign legal documents for 24 hours. Do not drink alcohol. Do not take sleeping pills or other medications that may interfere with treatment.  Continue all activities unless the activities cause more pain. When the pain lessens, slowly resume normal activities. Gradually increase the intensity and duration of the activities or exercise.  During periods of severe pain, bed rest may be helpful. Lay or sit in any position that is comfortable.  Putting ice on the injured area.  Put ice in a bag.  Place a towel between your skin and the bag.  Leave the ice on for 15 to 20 minutes, 3 to 4 times a day.  Follow up with your caregiver for continued problems and no reason can be found for the pain. If the pain becomes worse or does not go away, it may be necessary to repeat tests or do additional testing. Your caregiver may need to look further for a possible cause. SEEK IMMEDIATE MEDICAL CARE IF:  You have pain that is getting worse and is not relieved by  medications.  You develop chest pain that is associated with shortness or breath, sweating, feeling sick to your stomach (nauseous), or throw up (vomit).  Your pain becomes localized to the abdomen.  You develop any new symptoms that seem different or that concern you. MAKE SURE YOU:   Understand these instructions.  Will watch your condition.  Will get help right away if you are not doing well or get worse.   This information is not intended to replace advice given to you by your health care provider. Make sure you discuss any questions you have with your health care provider.   Document Released: 04/26/2005 Document Revised: 07/19/2011 Document Reviewed: 12/29/2012 Elsevier Interactive Patient Education [employer]2016 Elsevier Inc.

## 2015-12-14 ENCOUNTER — Inpatient Hospital Stay (HOSPITAL_COMMUNITY)
Admission: AD | Admit: 2015-12-14 | Discharge: 2015-12-15 | Disposition: A | Discharge status: Home / Self Care | Payer: Medicaid Other | Source: Ambulatory Visit | Attending: Obstetrics and Gynecology | Admitting: Obstetrics and Gynecology

## 2015-12-14 ENCOUNTER — Encounter (HOSPITAL_COMMUNITY): Payer: Self-pay | Admitting: *Deleted

## 2015-12-14 DIAGNOSIS — O4703 False labor before 37 completed weeks of gestation, third trimester: Secondary | ICD-10-CM

## 2015-12-14 DIAGNOSIS — O471 False labor at or after 37 completed weeks of gestation: Secondary | ICD-10-CM | POA: Insufficient documentation

## 2015-12-14 DIAGNOSIS — Z3A31 31 weeks gestation of pregnancy: Secondary | ICD-10-CM | POA: Diagnosis not present

## 2015-12-14 DIAGNOSIS — A084 Viral intestinal infection, unspecified: Secondary | ICD-10-CM

## 2015-12-14 DIAGNOSIS — R112 Nausea with vomiting, unspecified: Secondary | ICD-10-CM | POA: Diagnosis present

## 2015-12-14 DIAGNOSIS — O26893 Other specified pregnancy related conditions, third trimester: Secondary | ICD-10-CM | POA: Diagnosis not present

## 2015-12-14 LAB — URINALYSIS, ROUTINE W REFLEX MICROSCOPIC
BILIRUBIN URINE: NEGATIVE
GLUCOSE, UA: NEGATIVE mg/dL
Hgb urine dipstick: NEGATIVE
KETONES UR: NEGATIVE mg/dL
Leukocytes, UA: NEGATIVE
Nitrite: NEGATIVE
PH: 6 (ref 5.0–8.0)
Protein, ur: NEGATIVE mg/dL
Specific Gravity, Urine: 1.015 (ref 1.005–1.030)

## 2015-12-14 LAB — WET PREP, GENITAL
CLUE CELLS WET PREP: NONE SEEN
SPERM: NONE SEEN
Trich, Wet Prep: NONE SEEN
Yeast Wet Prep HPF POC: NONE SEEN

## 2015-12-14 MED ORDER — ONDANSETRON 8 MG PO TBDP: 8.0000 mg | ORAL_TABLET | Freq: Three times a day (TID) | ORAL | 2 refills | Status: DC | PRN

## 2015-12-14 MED ORDER — ONDANSETRON HCL 4 MG/2ML IJ SOLN
4.0000 mg | Freq: Once | INTRAMUSCULAR | Status: AC
  Administered 2015-12-14: 4 mg via INTRAVENOUS
  Filled 2015-12-14: qty 2

## 2015-12-14 MED ORDER — LACTATED RINGERS IV BOLUS (SEPSIS)
1000.0000 mL | Freq: Once | INTRAVENOUS | Status: AC
  Administered 2015-12-14: 1000 mL via INTRAVENOUS

## 2015-12-14 MED ORDER — ONDANSETRON 8 MG PO TBDP: 8.0000 mg | ORAL_TABLET | Freq: Once | ORAL | Status: DC

## 2015-12-14 NOTE — Discharge Instructions (Signed)
Braxton Hicks Contractions Contractions of the uterus can occur throughout pregnancy. Contractions are not always a sign that you are in labor.  WHAT ARE BRAXTON HICKS CONTRACTIONS?  Contractions that occur before labor are called Braxton Hicks contractions, or false labor. Toward the end of pregnancy (32-34 weeks), these contractions can develop more often and may become more forceful. This is not true labor because these contractions do not result in opening (dilatation) and thinning of the cervix. They are sometimes difficult to tell apart from true labor because these contractions can be forceful and people have different pain tolerances. You should not feel embarrassed if you go to the hospital with false labor. Sometimes, the only way to tell if you are in true labor is for your health care provider to look for changes in the cervix. If there are no prenatal problems or other health problems associated with the pregnancy, it is completely safe to be sent home with false labor and await the onset of true labor. HOW CAN YOU TELL THE DIFFERENCE BETWEEN TRUE AND FALSE LABOR? False Labor  The contractions of false labor are usually shorter and not as hard as those of true labor.   The contractions are usually irregular.   The contractions are often felt in the front of the lower abdomen and in the groin.   The contractions may go away when you walk around or change positions while lying down.   The contractions get weaker and are shorter lasting as time goes on.   The contractions do not usually become progressively stronger, regular, and closer together as with true labor.  True Labor  Contractions in true labor last 30-70 seconds, become very regular, usually become more intense, and increase in frequency.   The contractions do not go away with walking.   The discomfort is usually felt in the top of the uterus and spreads to the lower abdomen and low back.   True labor can be  determined by your health care provider with an exam. This will show that the cervix is dilating and getting thinner.  WHAT TO REMEMBER  Keep up with your usual exercises and follow other instructions given by your health care provider.   Take medicines as directed by your health care provider.   Keep your regular prenatal appointments.   Eat and drink lightly if you think you are going into labor.   If Braxton Hicks contractions are making you uncomfortable:   Change your position from lying down or resting to walking, or from walking to resting.   Sit and rest in a tub of warm water.   Drink 2-3 glasses of water. Dehydration may cause these contractions.   Do slow and deep breathing several times an hour.  WHEN SHOULD I SEEK IMMEDIATE MEDICAL CARE? Seek immediate medical care if:  Your contractions become stronger, more regular, and closer together.   You have fluid leaking or gushing from your vagina.   You have a fever.   You pass blood-tinged mucus.   You have vaginal bleeding.   You have continuous abdominal pain.   You have low back pain that you never had before.   You feel your baby's head pushing down and causing pelvic pressure.   Your baby is not moving as much as it used to.    This information is not intended to replace advice given to you by your health care provider. Make sure you discuss any questions you have with your health care  provider.   Document Released: 04/26/2005 Document Revised: 05/01/2013 Document Reviewed: 02/05/2013 Elsevier Interactive Patient Education 2016 [employer]  Viral Gastroenteritis Viral gastroenteritis is also known as stomach flu. This condition affects the stomach and intestinal tract. It can cause sudden diarrhea and vomiting. The illness typically lasts 3 to 8 days. Most people develop an immune response that eventually gets rid of the virus. While this natural response develops, the virus can make  you quite ill. CAUSES  Many different viruses can cause gastroenteritis, such as rotavirus or noroviruses. You can catch one of these viruses by consuming contaminated food or water. You may also catch a virus by sharing utensils or other personal items with an infected person or by touching a contaminated surface. SYMPTOMS  The most common symptoms are diarrhea and vomiting. These problems can cause a severe loss of body fluids (dehydration) and a body salt (electrolyte) imbalance. Other symptoms may include:  Fever.  Headache.  Fatigue.  Abdominal pain. DIAGNOSIS  Your caregiver can usually diagnose viral gastroenteritis based on your symptoms and a physical exam. A stool sample may also be taken to test for the presence of viruses or other infections. TREATMENT  This illness typically goes away on its own. Treatments are aimed at rehydration. The most serious cases of viral gastroenteritis involve vomiting so severely that you are not able to keep fluids down. In these cases, fluids must be given through an intravenous line (IV). HOME CARE INSTRUCTIONS   Drink enough fluids to keep your urine clear or pale yellow. Drink small amounts of fluids frequently and increase the amounts as tolerated.  Ask your caregiver for specific rehydration instructions.  Avoid:  Foods high in sugar.  Alcohol.  Carbonated drinks.  Tobacco.  Juice.  Caffeine drinks.  Extremely hot or cold fluids.  Fatty, greasy foods.  Too much intake of anything at one time.  Dairy products until 24 to 48 hours after diarrhea stops.  You may consume probiotics. Probiotics are active cultures of beneficial bacteria. They may lessen the amount and number of diarrheal stools in adults. Probiotics can be found in yogurt with active cultures and in supplements.  Wash your hands well to avoid spreading the virus.  Only take over-the-counter or prescription medicines for pain, discomfort, or fever as directed  by your caregiver. Do not give aspirin to children. Antidiarrheal medicines are not recommended.  Ask your caregiver if you should continue to take your regular prescribed and over-the-counter medicines.  Keep all follow-up appointments as directed by your caregiver. SEEK IMMEDIATE MEDICAL CARE IF:   You are unable to keep fluids down.  You do not urinate at least once every 6 to 8 hours.  You develop shortness of breath.  You notice blood in your stool or vomit. This may look like coffee grounds.  You have abdominal pain that increases or is concentrated in one small area (localized).  You have persistent vomiting or diarrhea.  You have a fever.  The patient is a child younger than 3 months, and he or she has a fever.  The patient is a child older than 3 months, and he or she has a fever and persistent symptoms.  The patient is a child older than 3 months, and he or she has a fever and symptoms suddenly get worse.  The patient is a baby, and he or she has no tears when crying. MAKE SURE YOU:   Understand these instructions.  Will watch your condition.  Will get  help right away if you are not doing well or get worse.   This information is not intended to replace advice given to you by your health care provider. Make sure you discuss any questions you have with your health care provider.   Document Released: 04/26/2005 Document Revised: 07/19/2011 Document Reviewed: 02/10/2011 Elsevier Interactive Patient Education [employer].

## 2015-12-14 NOTE — MAU Note (Signed)
Pt reports diarrhea x 3 today, nausea and vomiting. abd pain.

## 2015-12-14 NOTE — MAU Provider Note (Signed)
Chief Complaint:  Nausea; Diarrhea; Emesis; and Abdominal Pain   First Provider Initiated Contact with Patient 12/14/15 2318     HPI: Loretta Ellis is a 30 y.o. [id number] at [redacted]w[redacted]d who presents to maternity admissions reporting nausea vomiting diarrhea x 3 today. Bale to keep down fluids, but not solids. Also reports increased braxton Hicks.  Location: supropubic Quality: cramping Severity: mild Duration: <24 hours Course: Unchanged Timing: intermittent Modifying factors: Improves w/ fluids Associated signs and symptoms: Pos for N/V/D. Neg for fever, chills, VB, LOF, vaginal discharge, urinary complaints.  Good fetal movement.    Past Medical History: Past Medical History:  Diagnosis Date  . Chlamydia   . Gonorrhea   . No pertinent past medical history     Past obstetric history: OB History  Gravida Para Term Preterm AB Living  4 1 1   2 1   SAB TAB Ectopic Multiple Live Births          1    # Outcome Date GA Lbr Len/2nd Weight Sex Delivery Anes PTL Lv  4 Current           3 Term 05/29/11 617w2d 22:18 / 00:57 5 lb 13.8 oz (2.659 kg) F Vag-Spont EPI  LIV     Birth Comments: WNL  2 AB           1 AB               Past Surgical History: Past Surgical History:  Procedure Laterality Date  . ADENOIDECTOMY    . EAR TUBE REMOVAL    . TONSILLECTOMY       Family History: Family History  Problem Relation Age of Onset  . Anesthesia problems Neg Hx   . Hypotension Neg Hx   . Malignant hyperthermia Neg Hx   . Pseudochol deficiency Neg Hx     Social History: Social History  Substance Use Topics  . Smoking status: Never Smoker  . Smokeless tobacco: Never Used  . Alcohol use No    Allergies: No Known Allergies  Meds:  Prescriptions Prior to Admission  Medication Sig Dispense Refill Last Dose  . ferrous sulfate 325 (65 FE) MG tablet Take 325 mg by mouth daily.  0 11/22/2015 at Unknown time    I have reviewed patient's Past Medical Hx, Surgical Hx, Family Hx, Social  Hx, medications and allergies.   ROS:  Review of Systems  Constitutional: Positive for appetite change. Negative for chills and fever.  Gastrointestinal: Positive for abdominal pain, diarrhea, nausea and vomiting. Negative for blood in stool and constipation.  Genitourinary: Negative for dysuria, flank pain, frequency, hematuria, urgency, vaginal bleeding and vaginal discharge.  Neurological: Negative for dizziness.    Physical Exam  Patient Vitals for the past 24 hrs:  BP Temp Temp src Pulse Resp SpO2 Height Weight  12/14/15 2214 142/79 98.1 F (36.7 C) Oral 71 19 100 % 5\' 8"  (1.727 m) 231 lb (104.8 kg)   Constitutional: Well-developed, well-nourished female in no acute distress.  Head: Mucus membranes moist Cardiovascular: normal rate Respiratory: normal effort GI: Abd soft, non-tender, gravid appropriate for gestational age. MS: Extremities nontender, no edema, normal ROM Neurologic: Alert and oriented x 4.  GU: Neg CVAT.  Pelvic: NEFG, physiologic discharge, no blood, cervix clean. No CMT   Cervix closed and long  FHT:  Baseline 145 , moderate variability, accelerations present, no decelerations Contractions: period of contractions q 3 mins, resolved w/ IV bolus.   Labs: Results for orders placed  or performed during the hospital encounter of 12/14/15 (from the past 24 hour(s))  Urinalysis, Routine w reflex microscopic (not at [facility])     Status: None   Collection Time: 12/14/15 10:15 PM  Result Value Ref Range   Color, Urine YELLOW YELLOW   APPearance CLEAR CLEAR   Specific Gravity, Urine 1.015 1.005 - 1.030   pH 6.0 5.0 - 8.0   Glucose, UA NEGATIVE NEGATIVE mg/dL   Hgb urine dipstick NEGATIVE NEGATIVE   Bilirubin Urine NEGATIVE NEGATIVE   Ketones, ur NEGATIVE NEGATIVE mg/dL   Protein, ur NEGATIVE NEGATIVE mg/dL   Nitrite NEGATIVE NEGATIVE   Leukocytes, UA NEGATIVE NEGATIVE  Wet prep, genital     Status: Abnormal   Collection Time: 12/14/15 11:24 PM  Result Value Ref  Range   Yeast Wet Prep HPF POC NONE SEEN NONE SEEN   Trich, Wet Prep NONE SEEN NONE SEEN   Clue Cells Wet Prep HPF POC NONE SEEN NONE SEEN   WBC, Wet Prep HPF POC MODERATE (A) NONE SEEN   Sperm NONE SEEN     Imaging:  No results found.  MAU Course: Orders Placed This Encounter  Procedures  . Wet prep, genital  . Urinalysis, Routine w reflex microscopic (not at [facility])   Meds ordered this encounter  Medications  . DISCONTD: ondansetron (ZOFRAN-ODT) disintegrating tablet 8 mg  . lactated ringers bolus 1,000 mL  . ondansetron (ZOFRAN) injection 4 mg  . ondansetron (ZOFRAN ODT) 8 MG disintegrating tablet    Sig: Take 1 tablet (8 mg total) by mouth every 8 (eight) hours as needed for nausea or vomiting.    Dispense:  20 tablet    Refill:  2    Order Specific Question:   Supervising Provider    Answer:   Adam Phenix [[id number]]   MDM: - Viral gastroenteritis w/out dehydration. Tolerating PO's. - Preterm contractions w/out evidence of active preterm labor.   Assessment: 1. Preterm contractions, third trimester   2. Viral gastroenteritis     Plan: Discharge home in stable condition per consult w/ Dr. Mindi Slicker.  Clear liquids x 24 hours, then advance diet slowly.  Preterm labor precautions and fetal kick counts Follow-up Information    [facility] Follow up in 1 week(s).   Why:  as scheduled or sooner as needed if symptoms worsen Contact information: [address]  [address] [place] [address] [phone]        [facility] [place] MATERNITY ADMISSIONS .   Contact information: [address] [id number] mc [place] [address] [phone]            Medication List    TAKE these medications   ferrous sulfate 325 (65 FE) MG tablet Take 325 mg by mouth daily.   ondansetron 8 MG disintegrating tablet Commonly known as:  ZOFRAN ODT Take 1 tablet (8 mg total) by mouth every 8 (eight) hours as needed for nausea  or vomiting.       [place], CNM 12/14/2015 11:58 PM

## 2015-12-15 LAB — GC/CHLAMYDIA PROBE AMP (~~LOC~~) NOT AT ARMC
CHLAMYDIA, DNA PROBE: NEGATIVE
NEISSERIA GONORRHEA: NEGATIVE

## 2016-01-14 LAB — OB RESULTS CONSOLE GBS: GBS: NEGATIVE

## 2016-01-15 ENCOUNTER — Inpatient Hospital Stay (HOSPITAL_COMMUNITY)
Admission: AD | Admit: 2016-01-15 | Discharge: 2016-01-15 | Disposition: A | Discharge status: Home / Self Care | Payer: Medicaid Other | Source: Ambulatory Visit | Attending: Obstetrics and Gynecology | Admitting: Obstetrics and Gynecology

## 2016-01-15 ENCOUNTER — Encounter (HOSPITAL_COMMUNITY): Payer: Self-pay

## 2016-01-15 DIAGNOSIS — Z3A35 35 weeks gestation of pregnancy: Secondary | ICD-10-CM | POA: Diagnosis not present

## 2016-01-15 DIAGNOSIS — O4703 False labor before 37 completed weeks of gestation, third trimester: Secondary | ICD-10-CM

## 2016-01-15 LAB — URINALYSIS, ROUTINE W REFLEX MICROSCOPIC
Bilirubin Urine: NEGATIVE
GLUCOSE, UA: NEGATIVE mg/dL
Hgb urine dipstick: NEGATIVE
Ketones, ur: NEGATIVE mg/dL
Nitrite: NEGATIVE
PH: 5.5 (ref 5.0–8.0)
Protein, ur: NEGATIVE mg/dL
Specific Gravity, Urine: <1.005 — ABNORMAL LOW (ref 1.005–1.030)

## 2016-01-15 LAB — URINE MICROSCOPIC-ADD ON: RBC / HPF: NONE SEEN RBC/hpf (ref 0–5)

## 2016-01-15 MED ORDER — BETAMETHASONE SOD PHOS & ACET 6 (3-3) MG/ML IJ SUSP
12.0000 mg | INTRAMUSCULAR | Status: DC
  Administered 2016-01-15: 12 mg via INTRAMUSCULAR
  Filled 2016-01-15: qty 2

## 2016-01-15 NOTE — MAU Provider Note (Signed)
Chief Complaint:  Contractions   First Provider Initiated Contact with Patient 01/15/16 1825     HPI: Loretta Ellis is a 30 y.o. G4P1021 at 5135w4dwho presents to maternity admissions reporting contractions every 6 minutes.   States Dr Jackelyn KnifeMeisinger told her she was 1cm and that he would not stop her labor if it started.. She reports good fetal movement, denies LOF, vaginal bleeding, vaginal itching/burning, urinary symptoms, h/a, dizziness, n/v, diarrhea, constipation or fever/chills.    Abdominal Pain  This is a new problem. The current episode started today. The onset quality is gradual. The problem occurs intermittently. The problem has been waxing and waning. The pain is located in the LLQ, RLQ and suprapubic region. The pain is moderate. The quality of the pain is cramping. The abdominal pain does not radiate. Pertinent negatives include no constipation, diarrhea, fever, myalgias, nausea or vomiting. Nothing aggravates the pain. The pain is relieved by nothing. She has tried nothing for the symptoms.   RN Note: Pt reports she has had ctx on and  off since Tuesday night. Went to MD yesterday and was told she was 1cm dilated. Ctx worse today about 6 min apart.  Past Medical History: Past Medical History:  Diagnosis Date  . Chlamydia   . Gonorrhea   . No pertinent past medical history     Past obstetric history: OB History  Gravida Para Term Preterm AB Living  4 1 1   2 1   SAB TAB Ectopic Multiple Live Births          1    # Outcome Date GA Lbr Len/2nd Weight Sex Delivery Anes PTL Lv  4 Current           3 Term 05/29/11 [id number] 22:18 / 00:57 5 lb 13.8 oz (2.659 kg) F Vag-Spont EPI  LIV     Birth Comments: WNL  2 AB           1 AB               Past Surgical History: Past Surgical History:  Procedure Laterality Date  . ADENOIDECTOMY    . EAR TUBE REMOVAL    . TONSILLECTOMY      Family History: Family History  Problem Relation Age of Onset  . Anesthesia problems Neg Hx   .  Hypotension Neg Hx   . Malignant hyperthermia Neg Hx   . Pseudochol deficiency Neg Hx     Social History: Social History  Substance Use Topics  . Smoking status: Never Smoker  . Smokeless tobacco: Never Used  . Alcohol use No    Allergies: No Known Allergies  Meds:  Prescriptions Prior to Admission  Medication Sig Dispense Refill Last Dose  . ferrous sulfate 325 (65 FE) MG tablet Take 325 mg by mouth daily with breakfast.   0 Past Week at Unknown time  . ondansetron (ZOFRAN ODT) 8 MG disintegrating tablet Take 1 tablet (8 mg total) by mouth every 8 (eight) hours as needed for nausea or vomiting. 20 tablet 2 Past Month at Unknown time  . Prenatal MV-Min-FA-Omega-3 (PRENATAL GUMMIES/DHA & FA) 0.4-32.5 MG CHEW Chew 2 each by mouth daily.   Past Week at Unknown time    I have reviewed patient's Past Medical Hx, Surgical Hx, Family Hx, Social Hx, medications and allergies.   ROS:  Review of Systems  Constitutional: Negative for fever.  Gastrointestinal: Positive for abdominal pain. Negative for constipation, diarrhea, nausea and vomiting.  Musculoskeletal: Negative for myalgias.  Other systems negative  Physical Exam  Patient Vitals for the past 24 hrs:  BP Temp Temp src Pulse Resp Height Weight  01/15/16 1743 150/80 97.5 F (36.4 C) Oral 81 18 5\' 8"  (1.727 m) 233 lb (105.7 kg)   Constitutional: Well-developed, well-nourished female in no acute distress.  Cardiovascular: normal rate and rhythm Respiratory: normal effort, clear to auscultation bilaterally GI: Abd soft, non-tender, gravid appropriate for gestational age.   No rebound or guarding. MS: Extremities nontender, no edema, normal ROM Neurologic: Alert and oriented x 4.  GU: Neg CVAT.  Initial Cervix exam: Dilation FingerTip Effacement:  60% Station:  Ballotable Exam by:  Verline Kong CNM  Then over the next hour, contractions got more painful.  Cervix rechecked.  Dilation: 1.5 Effacement (%): 60 Cervical  Position: Middle Station: Ballotable Exam by:: [employer]  FHT:  Baseline 145 , moderate variability, accelerations present, no decelerations Contractions: q 3 mins Irregular     Labs: Results for orders placed or performed during the hospital encounter of 01/15/16 (from the past 24 hour(s))  Urinalysis, Routine w reflex microscopic (not at [facility])     Status: Abnormal   Collection Time: 01/15/16  5:40 PM  Result Value Ref Range   Color, Urine YELLOW YELLOW   APPearance CLEAR CLEAR   Specific Gravity, Urine <1.005 (L) 1.005 - 1.030   pH 5.5 5.0 - 8.0   Glucose, UA NEGATIVE NEGATIVE mg/dL   Hgb urine dipstick NEGATIVE NEGATIVE   Bilirubin Urine NEGATIVE NEGATIVE   Ketones, ur NEGATIVE NEGATIVE mg/dL   Protein, ur NEGATIVE NEGATIVE mg/dL   Nitrite NEGATIVE NEGATIVE   Leukocytes, UA SMALL (A) NEGATIVE  Urine microscopic-add on     Status: Abnormal   Collection Time: 01/15/16  5:40 PM  Result Value Ref Range   Squamous Epithelial / LPF 6-30 (A) NONE SEEN   WBC, UA 0-5 0 - 5 WBC/hpf   RBC / HPF NONE SEEN 0 - 5 RBC/hpf   Bacteria, UA FEW (A) NONE SEEN      Imaging:  No results found.  MAU Course/MDM: I have ordered labs and reviewed results.  NST reviewed Consult Dr Ellyn Hack with presentation, exam findings and test results.  She recommended labor observation  Assessment: [facility] at [redacted]w[redacted]d Preterm labor Small cervical change over first hour  Plan: Will observe one more hour RN will communicate with Dr Ellyn Hack and she will decide on disposition   Wynelle Bourgeois CNM, MSN Certified Nurse-Midwife 01/15/2016 7:51 PM

## 2016-01-15 NOTE — MAU Note (Signed)
No cervical change on third check. Ctx are spreading out a bit. BMZ given. Instructed to tell patient to come back tomorrow to get second dose of BMZ. Discharge pt home.

## 2016-01-15 NOTE — MAU Note (Signed)
Pt reports she has had ctx on and  off since Tuesday night. Went to MD yesterday and was told she was 1cm dilated. Ctx worse today about 6 min apart.

## 2016-01-16 ENCOUNTER — Encounter (HOSPITAL_COMMUNITY): Payer: Self-pay | Admitting: *Deleted

## 2016-01-16 ENCOUNTER — Inpatient Hospital Stay (HOSPITAL_COMMUNITY)
Admission: AD | Admit: 2016-01-16 | Discharge: 2016-01-16 | Disposition: A | Discharge status: Home / Self Care | Payer: Medicaid Other | Source: Ambulatory Visit | Attending: Obstetrics and Gynecology | Admitting: Obstetrics and Gynecology

## 2016-01-16 DIAGNOSIS — O163 Unspecified maternal hypertension, third trimester: Secondary | ICD-10-CM | POA: Diagnosis not present

## 2016-01-16 DIAGNOSIS — O26893 Other specified pregnancy related conditions, third trimester: Secondary | ICD-10-CM | POA: Diagnosis not present

## 2016-01-16 DIAGNOSIS — Z8619 Personal history of other infectious and parasitic diseases: Secondary | ICD-10-CM | POA: Insufficient documentation

## 2016-01-16 DIAGNOSIS — R51 Headache: Secondary | ICD-10-CM | POA: Insufficient documentation

## 2016-01-16 DIAGNOSIS — Z3A35 35 weeks gestation of pregnancy: Secondary | ICD-10-CM | POA: Insufficient documentation

## 2016-01-16 DIAGNOSIS — Z8632 Personal history of gestational diabetes: Secondary | ICD-10-CM | POA: Insufficient documentation

## 2016-01-16 DIAGNOSIS — R0989 Other specified symptoms and signs involving the circulatory and respiratory systems: Secondary | ICD-10-CM

## 2016-01-16 DIAGNOSIS — I10 Essential (primary) hypertension: Secondary | ICD-10-CM | POA: Insufficient documentation

## 2016-01-16 DIAGNOSIS — O4703 False labor before 37 completed weeks of gestation, third trimester: Secondary | ICD-10-CM

## 2016-01-16 LAB — URINALYSIS, ROUTINE W REFLEX MICROSCOPIC
Bilirubin Urine: NEGATIVE
GLUCOSE, UA: NEGATIVE mg/dL
HGB URINE DIPSTICK: NEGATIVE
Ketones, ur: NEGATIVE mg/dL
LEUKOCYTES UA: NEGATIVE
Nitrite: NEGATIVE
PH: 6 (ref 5.0–8.0)
Protein, ur: NEGATIVE mg/dL
Specific Gravity, Urine: 1.01 (ref 1.005–1.030)

## 2016-01-16 LAB — COMPREHENSIVE METABOLIC PANEL
ALK PHOS: 130 U/L — AB (ref 38–126)
ALT: 14 U/L (ref 14–54)
ANION GAP: 7 (ref 5–15)
AST: 17 U/L (ref 15–41)
Albumin: 2.9 g/dL — ABNORMAL LOW (ref 3.5–5.0)
BILIRUBIN TOTAL: 0.1 mg/dL — AB (ref 0.3–1.2)
BUN: 8 mg/dL (ref 6–20)
CALCIUM: 9 mg/dL (ref 8.9–10.3)
CO2: 23 mmol/L (ref 22–32)
Chloride: 107 mmol/L (ref 101–111)
Creatinine, Ser: 0.69 mg/dL (ref 0.44–1.00)
GLUCOSE: 117 mg/dL — AB (ref 65–99)
Potassium: 4.4 mmol/L (ref 3.5–5.1)
Sodium: 137 mmol/L (ref 135–145)
TOTAL PROTEIN: 6.6 g/dL (ref 6.5–8.1)

## 2016-01-16 LAB — CBC
HEMATOCRIT: 28.9 % — AB (ref 36.0–46.0)
HEMOGLOBIN: 9.2 g/dL — AB (ref 12.0–15.0)
MCH: 25.4 pg — AB (ref 26.0–34.0)
MCHC: 31.8 g/dL (ref 30.0–36.0)
MCV: 79.8 fL (ref 78.0–100.0)
Platelets: 409 10*3/uL — ABNORMAL HIGH (ref 150–400)
RBC: 3.62 MIL/uL — ABNORMAL LOW (ref 3.87–5.11)
RDW: 17.5 % — ABNORMAL HIGH (ref 11.5–15.5)
WBC: 19.6 10*3/uL — ABNORMAL HIGH (ref 4.0–10.5)

## 2016-01-16 LAB — PROTEIN / CREATININE RATIO, URINE: CREATININE, URINE: 66 mg/dL

## 2016-01-16 MED ORDER — ACETAMINOPHEN 325 MG PO TABS
650.0000 mg | ORAL_TABLET | Freq: Four times a day (QID) | ORAL | Status: DC | PRN
  Administered 2016-01-16: 650 mg via ORAL
  Filled 2016-01-16: qty 2

## 2016-01-16 MED ORDER — BETAMETHASONE SOD PHOS & ACET 6 (3-3) MG/ML IJ SUSP
12.0000 mg | Freq: Once | INTRAMUSCULAR | Status: AC
  Administered 2016-01-16: 12 mg via INTRAMUSCULAR
  Filled 2016-01-16: qty 2

## 2016-01-16 NOTE — Discharge Instructions (Signed)

## 2016-01-16 NOTE — MAU Provider Note (Signed)
Chief Complaint:  Injections   First Provider Initiated Contact with Patient 01/16/16 2108     HPI: Loretta Ellis is a 30 y.o. [id number] at 3135w5dwho presents to maternity admissions reporting headache today. Has not taken anything for it.  Hx Gestational hypertension with last pregnancy. None this pregnancy.. She reports good fetal movement, denies LOF, vaginal bleeding, vaginal itching/burning, urinary symptoms, dizziness, n/v, diarrhea, constipation or fever/chills.  She denies visual changes or RUQ abdominal pain.  Had presented for second betamethasone after being seen for preterm labor yesterday, but noted to have elevated BP upon assessment.   Headache   This is a new problem. The current episode started today. The problem occurs constantly. The problem has been unchanged. The pain is located in the bilateral and frontal region. The pain does not radiate. The pain quality is similar to prior headaches. The quality of the pain is described as aching and dull. The pain is mild. Pertinent negatives include no abdominal pain, back pain, blurred vision, dizziness, fever, nausea, numbness, photophobia, sinus pressure, tingling, visual change or vomiting. Nothing aggravates the symptoms. She has tried nothing for the symptoms.   RN Note: Here for second BMZ injection. Has had some ctxs today but no more than usual. Also has a headache since 1800. Denies LOF or bleeding  Past Medical History: Past Medical History:  Diagnosis Date  . Chlamydia   . Gonorrhea   . No pertinent past medical history     Past obstetric history: OB History  Gravida Para Term Preterm AB Living  4 1 1   2 1   SAB TAB Ectopic Multiple Live Births          1    # Outcome Date GA Lbr Len/2nd Weight Sex Delivery Anes PTL Lv  4 Current           3 Term 05/29/11 2891w2d 22:18 / 00:57 5 lb 13.8 oz (2.659 kg) F Vag-Spont EPI  LIV     Birth Comments: WNL  2 AB           1 AB               Past Surgical History: Past  Surgical History:  Procedure Laterality Date  . ADENOIDECTOMY    . EAR TUBE REMOVAL    . TONSILLECTOMY      Family History: Family History  Problem Relation Age of Onset  . Anesthesia problems Neg Hx   . Hypotension Neg Hx   . Malignant hyperthermia Neg Hx   . Pseudochol deficiency Neg Hx     Social History: Social History  Substance Use Topics  . Smoking status: Never Smoker  . Smokeless tobacco: Never Used  . Alcohol use No    Allergies: No Known Allergies  Meds:  Prescriptions Prior to Admission  Medication Sig Dispense Refill Last Dose  . ferrous sulfate 325 (65 FE) MG tablet Take 325 mg by mouth daily with breakfast.   0 Past Week at Unknown time  . ondansetron (ZOFRAN ODT) 8 MG disintegrating tablet Take 1 tablet (8 mg total) by mouth every 8 (eight) hours as needed for nausea or vomiting. 20 tablet 2 Past Month at Unknown time  . Prenatal MV-Min-FA-Omega-3 (PRENATAL GUMMIES/DHA & FA) 0.4-32.5 MG CHEW Chew 2 each by mouth daily.   Past Week at Unknown time    I have reviewed patient's Past Medical Hx, Surgical Hx, Family Hx, Social Hx, medications and allergies.   ROS:  Review of Systems  Constitutional: Negative for fever.  HENT: Negative for sinus pressure.   Eyes: Negative for blurred vision and photophobia.  Gastrointestinal: Negative for abdominal pain, nausea and vomiting.  Musculoskeletal: Negative for back pain.  Neurological: Positive for headaches. Negative for dizziness, tingling and numbness.   Other systems negative  Physical Exam  Patient Vitals for the past 24 hrs:  BP Temp Pulse Resp Height Weight  01/16/16 2101 142/81 - 92 - - -  01/16/16 2046 152/83 - 88 - - -  01/16/16 2033 146/86 - 94 - - -  01/16/16 2023 136/91 - - - - -  01/16/16 2022 144/81 - - - - -  01/16/16 2016 - 98.7 F (37.1 C) - 18 5\' 8"  (1.727 m) 234 lb 1.3 oz (106.2 kg)   Constitutional: Well-developed, well-nourished female in no acute distress.  Cardiovascular: normal  rate and rhythm Respiratory: normal effort, clear to auscultation bilaterally GI: Abd soft, non-tender, gravid appropriate for gestational age.   No rebound or guarding. MS: Extremities nontender, no edema, normal ROM Neurologic: Alert and oriented x 4.   DTRs 2+, no clonus GU: Neg CVAT.   FHT:  Baseline 140 , moderate variability, accelerations present, no decelerations Contractions: Irregular   Labs: Results for orders placed or performed during the hospital encounter of 01/16/16 (from the past 24 hour(s))  Urinalysis, Routine w reflex microscopic (not at [facility])     Status: None   Collection Time: 01/16/16  8:30 PM  Result Value Ref Range   Color, Urine YELLOW YELLOW   APPearance CLEAR CLEAR   Specific Gravity, Urine 1.010 1.005 - 1.030   pH 6.0 5.0 - 8.0   Glucose, UA NEGATIVE NEGATIVE mg/dL   Hgb urine dipstick NEGATIVE NEGATIVE   Bilirubin Urine NEGATIVE NEGATIVE   Ketones, ur NEGATIVE NEGATIVE mg/dL   Protein, ur NEGATIVE NEGATIVE mg/dL   Nitrite NEGATIVE NEGATIVE   Leukocytes, UA NEGATIVE NEGATIVE  Protein / creatinine ratio, urine     Status: None   Collection Time: 01/16/16  8:30 PM  Result Value Ref Range   Creatinine, Urine 66.00 mg/dL   Total Protein, Urine <6.00 mg/dL   Protein Creatinine Ratio        0.00 - 0.15 mg/mg[Cre]  CBC     Status: Abnormal   Collection Time: 01/16/16  9:35 PM  Result Value Ref Range   WBC 19.6 (H) 4.0 - 10.5 K/uL   RBC 3.62 (L) 3.87 - 5.11 MIL/uL   Hemoglobin 9.2 (L) 12.0 - 15.0 g/dL   HCT 16.1 (L) 09.6 - 04.5 %   MCV 79.8 78.0 - 100.0 fL   MCH 25.4 (L) 26.0 - 34.0 pg   MCHC 31.8 30.0 - 36.0 g/dL   RDW 40.9 (H) 81.1 - 91.4 %   Platelets 409 (H) 150 - 400 K/uL  Comprehensive metabolic panel     Status: Abnormal   Collection Time: 01/16/16  9:35 PM  Result Value Ref Range   Sodium 137 135 - 145 mmol/L   Potassium 4.4 3.5 - 5.1 mmol/L   Chloride 107 101 - 111 mmol/L   CO2 23 22 - 32 mmol/L   Glucose, Bld 117 (H) 65 - 99 mg/dL    BUN 8 6 - 20 mg/dL   Creatinine, Ser 7.82 0.44 - 1.00 mg/dL   Calcium 9.0 8.9 - 95.6 mg/dL   Total Protein 6.6 6.5 - 8.1 g/dL   Albumin 2.9 (L) 3.5 - 5.0 g/dL   AST 17 15 - 41 U/L  ALT 14 14 - 54 U/L   Alkaline Phosphatase 130 (H) 38 - 126 U/L   Total Bilirubin 0.1 (L) 0.3 - 1.2 mg/dL   GFR calc non Af Amer >60 >60 mL/min   GFR calc Af Amer >60 >60 mL/min   Anion gap 7 5 - 15    Imaging:  No results found.  MAU Course/MDM: I have ordered labs and reviewed results.   PIH labs ordered. Labs within normal limits NST reviewed Consult Dr Senaida Ores with presentation, exam findings and test results. She reviewed BPs.  Patient may go home if labs normal. Treatments in MAU included none.    Assessment: SIUP at [redacted]w[redacted]d Preterm labor, stable Labile hypertension, now normotensive  Plan: Discharge home Labor precautions and fetal kick counts Follow up in Office for prenatal visits and recheck  Pt stable at time of discharge.  Wynelle Bourgeois CNM, MSN Certified Nurse-Midwife 01/16/2016 9:15 PM

## 2016-01-16 NOTE — MAU Note (Signed)
Here for second BMZ injection. Has had some ctxs today but no more than usual. Also has a headache since 1800. Denies LOF or bleeding

## 2016-02-10 ENCOUNTER — Inpatient Hospital Stay (HOSPITAL_COMMUNITY): Payer: Medicaid Other | Admitting: Anesthesiology

## 2016-02-10 ENCOUNTER — Encounter (HOSPITAL_COMMUNITY): Payer: Self-pay | Admitting: *Deleted

## 2016-02-10 ENCOUNTER — Inpatient Hospital Stay (HOSPITAL_COMMUNITY)
Admission: AD | Admit: 2016-02-10 | Discharge: 2016-02-12 | DRG: 775 | Disposition: A | Discharge status: Home / Self Care | Payer: Medicaid Other | Source: Ambulatory Visit | Attending: Obstetrics and Gynecology | Admitting: Obstetrics and Gynecology

## 2016-02-10 DIAGNOSIS — Z823 Family history of stroke: Secondary | ICD-10-CM | POA: Diagnosis not present

## 2016-02-10 DIAGNOSIS — Z833 Family history of diabetes mellitus: Secondary | ICD-10-CM

## 2016-02-10 DIAGNOSIS — Z3483 Encounter for supervision of other normal pregnancy, third trimester: Secondary | ICD-10-CM | POA: Diagnosis present

## 2016-02-10 DIAGNOSIS — O4202 Full-term premature rupture of membranes, onset of labor within 24 hours of rupture: Secondary | ICD-10-CM | POA: Diagnosis present

## 2016-02-10 DIAGNOSIS — Z8249 Family history of ischemic heart disease and other diseases of the circulatory system: Secondary | ICD-10-CM

## 2016-02-10 DIAGNOSIS — Z3A39 39 weeks gestation of pregnancy: Secondary | ICD-10-CM | POA: Diagnosis not present

## 2016-02-10 HISTORY — DX: Cardiac murmur, unspecified: R01.1

## 2016-02-10 HISTORY — DX: Gestational (pregnancy-induced) hypertension without significant proteinuria, unspecified trimester: O13.9

## 2016-02-10 HISTORY — DX: Anemia, unspecified: D64.9

## 2016-02-10 LAB — COMPREHENSIVE METABOLIC PANEL
ALBUMIN: 2.6 g/dL — AB (ref 3.5–5.0)
ALK PHOS: 179 U/L — AB (ref 38–126)
ALT: 11 U/L — AB (ref 14–54)
AST: 18 U/L (ref 15–41)
Anion gap: 7 (ref 5–15)
BUN: <5 mg/dL — ABNORMAL LOW (ref 6–20)
CALCIUM: 8.9 mg/dL (ref 8.9–10.3)
CO2: 19 mmol/L — AB (ref 22–32)
CREATININE: 0.56 mg/dL (ref 0.44–1.00)
Chloride: 108 mmol/L (ref 101–111)
GFR calc Af Amer: >60 mL/min (ref >60)
GFR calc non Af Amer: >60 mL/min (ref >60)
GLUCOSE: 88 mg/dL (ref 65–99)
Potassium: 4 mmol/L (ref 3.5–5.1)
SODIUM: 134 mmol/L — AB (ref 135–145)
Total Bilirubin: 0.3 mg/dL (ref 0.3–1.2)
Total Protein: 6.2 g/dL — ABNORMAL LOW (ref 6.5–8.1)

## 2016-02-10 LAB — CBC
HEMATOCRIT: 29.9 % — AB (ref 36.0–46.0)
HEMOGLOBIN: 9.6 g/dL — AB (ref 12.0–15.0)
MCH: 24.9 pg — ABNORMAL LOW (ref 26.0–34.0)
MCHC: 32.1 g/dL (ref 30.0–36.0)
MCV: 77.7 fL — ABNORMAL LOW (ref 78.0–100.0)
Platelets: 454 10*3/uL — ABNORMAL HIGH (ref 150–400)
RBC: 3.85 MIL/uL — ABNORMAL LOW (ref 3.87–5.11)
RDW: 17.1 % — ABNORMAL HIGH (ref 11.5–15.5)
WBC: 12.1 10*3/uL — ABNORMAL HIGH (ref 4.0–10.5)

## 2016-02-10 LAB — TYPE AND SCREEN
ABO/RH(D): O POS
Antibody Screen: NEGATIVE

## 2016-02-10 LAB — RPR: RPR: NONREACTIVE

## 2016-02-10 LAB — PROTEIN / CREATININE RATIO, URINE
Creatinine, Urine: 63 mg/dL
Total Protein, Urine: <6 mg/dL

## 2016-02-10 MED ORDER — OXYTOCIN 40 UNITS IN LACTATED RINGERS INFUSION - SIMPLE MED: 2.5000 [IU]/h | INTRAVENOUS | Status: DC

## 2016-02-10 MED ORDER — LACTATED RINGERS IV SOLN
500.0000 mL | Freq: Once | INTRAVENOUS | Status: AC
  Administered 2016-02-10: 500 mL via INTRAVENOUS

## 2016-02-10 MED ORDER — ONDANSETRON HCL 4 MG/2ML IJ SOLN
4.0000 mg | Freq: Four times a day (QID) | INTRAMUSCULAR | Status: DC | PRN
  Administered 2016-02-10: 4 mg via INTRAVENOUS
  Filled 2016-02-10: qty 2

## 2016-02-10 MED ORDER — FLEET ENEMA 7-19 GM/118ML RE ENEM: 1.0000 | ENEMA | RECTAL | Status: DC | PRN

## 2016-02-10 MED ORDER — EPHEDRINE 5 MG/ML INJ
10.0000 mg | INTRAVENOUS | Status: DC | PRN
  Filled 2016-02-10: qty 4

## 2016-02-10 MED ORDER — ACETAMINOPHEN 325 MG PO TABS
650.0000 mg | ORAL_TABLET | ORAL | Status: DC | PRN
  Administered 2016-02-10: 650 mg via ORAL
  Filled 2016-02-10: qty 2

## 2016-02-10 MED ORDER — TERBUTALINE SULFATE 1 MG/ML IJ SOLN
0.2500 mg | Freq: Once | INTRAMUSCULAR | Status: DC | PRN
  Filled 2016-02-10: qty 1

## 2016-02-10 MED ORDER — LIDOCAINE HCL (PF) 1 % IJ SOLN
30.0000 mL | INTRAMUSCULAR | Status: DC | PRN
  Filled 2016-02-10: qty 30

## 2016-02-10 MED ORDER — DIPHENHYDRAMINE HCL 50 MG/ML IJ SOLN: 12.5000 mg | INTRAMUSCULAR | Status: DC | PRN

## 2016-02-10 MED ORDER — FENTANYL 2.5 MCG/ML BUPIVACAINE 1/10 % EPIDURAL INFUSION (WH - ANES)
14.0000 mL/h | INTRAMUSCULAR | Status: DC | PRN
  Administered 2016-02-10: 14 mL/h via EPIDURAL
  Filled 2016-02-10 (×2): qty 125

## 2016-02-10 MED ORDER — OXYTOCIN 40 UNITS IN LACTATED RINGERS INFUSION - SIMPLE MED
1.0000 m[IU]/min | INTRAVENOUS | Status: DC
  Administered 2016-02-10: 2 m[IU]/min via INTRAVENOUS
  Filled 2016-02-10: qty 1000

## 2016-02-10 MED ORDER — OXYCODONE-ACETAMINOPHEN 5-325 MG PO TABS: 2.0000 | ORAL_TABLET | ORAL | Status: DC | PRN

## 2016-02-10 MED ORDER — LACTATED RINGERS IV SOLN
INTRAVENOUS | Status: DC
  Administered 2016-02-10: 09:00:00 via INTRAVENOUS

## 2016-02-10 MED ORDER — OXYTOCIN BOLUS FROM INFUSION
500.0000 mL | Freq: Once | INTRAVENOUS | Status: AC
  Administered 2016-02-10: 500 mL via INTRAVENOUS

## 2016-02-10 MED ORDER — PHENYLEPHRINE 40 MCG/ML (10ML) SYRINGE FOR IV PUSH (FOR BLOOD PRESSURE SUPPORT)
80.0000 ug | PREFILLED_SYRINGE | INTRAVENOUS | Status: DC | PRN
  Filled 2016-02-10: qty 5

## 2016-02-10 MED ORDER — PHENYLEPHRINE 40 MCG/ML (10ML) SYRINGE FOR IV PUSH (FOR BLOOD PRESSURE SUPPORT)
80.0000 ug | PREFILLED_SYRINGE | INTRAVENOUS | Status: DC | PRN
  Filled 2016-02-10: qty 10
  Filled 2016-02-10: qty 5

## 2016-02-10 MED ORDER — OXYCODONE-ACETAMINOPHEN 5-325 MG PO TABS: 1.0000 | ORAL_TABLET | ORAL | Status: DC | PRN

## 2016-02-10 MED ORDER — LACTATED RINGERS IV SOLN: 500.0000 mL | INTRAVENOUS | Status: DC | PRN

## 2016-02-10 MED ORDER — SOD CITRATE-CITRIC ACID 500-334 MG/5ML PO SOLN: 30.0000 mL | ORAL | Status: DC | PRN

## 2016-02-10 NOTE — Progress Notes (Signed)
Comfortable with epidural Afeb, VSS, BP 120-150/80-90 FHT- Cat I, early decels, mod variability VE-4/70/-1, vtx, IUPC inserted Prolonged active phase vs latent labor, IUPC inserted to better monitor ctx, monitor progress, BP ok for now

## 2016-02-10 NOTE — Progress Notes (Signed)
Comfortable with epidural Afeb, VSS, BP 130-150/80-90 FHT- 130-140, mod variability, + accels, occasional variable or early decel, Cat II, ctx q 4-5 min on 9 mu/min pitocin VE-4/70/-1, vtx Continue pitocin, monitor progress

## 2016-02-10 NOTE — Anesthesia Preprocedure Evaluation (Signed)
Anesthesia Evaluation  Patient identified by MRN, date of birth, ID band Patient awake    Reviewed: Allergy & Precautions, H&P , Patient's Chart, lab work & pertinent test results  Airway Mallampati: II  TM Distance: >3 FB Neck ROM: full    Dental  (+) Teeth Intact   Pulmonary    breath sounds clear to auscultation       Cardiovascular hypertension,  Rhythm:regular Rate:Normal     Neuro/Psych    GI/Hepatic   Endo/Other    Renal/GU      Musculoskeletal   Abdominal   Peds  Hematology  (+) anemia ,   Anesthesia Other Findings  HTN     Reproductive/Obstetrics (+) Pregnancy                             Anesthesia Physical Anesthesia Plan  ASA: III  Anesthesia Plan: Epidural   Post-op Pain Management:    Induction:   Airway Management Planned:   Additional Equipment:   Intra-op Plan:   Post-operative Plan:   Informed Consent: I have reviewed the patients History and Physical, chart, labs and discussed the procedure including the risks, benefits and alternatives for the proposed anesthesia with the patient or authorized representative who has indicated his/her understanding and acceptance.   Dental Advisory Given  Plan Discussed with:   Anesthesia Plan Comments: (Labs checked- platelets confirmed with RN in room. Fetal heart tracing, per RN, reported to be stable enough for sitting procedure. Discussed epidural, and patient consents to the procedure:  included risk of possible headache,backache, failed block, allergic reaction, and nerve injury. This patient was asked if she had any questions or concerns before the procedure started.)        Anesthesia Quick Evaluation

## 2016-02-10 NOTE — H&P (Signed)
Loretta Ellis is a 30 y.o. female, G4 P1021, EGA 39+ weeks with EDC 10-8 presenting for leaking fluid.  Eval in MAU confirmed ROM, she has been having ctx on her own.  Prenatal care essentially uncomplicated.  OB History    Gravida Para Term Preterm AB Living   4 1 1   2 1    SAB TAB Ectopic Multiple Live Births           1     Past Medical History:  Diagnosis Date  . Anemia   . Chlamydia   . Gonorrhea   . Heart murmur   . No pertinent past medical history   . Pregnancy induced hypertension    Past Surgical History:  Procedure Laterality Date  . ADENOIDECTOMY    . EAR TUBE REMOVAL    . TONSILLECTOMY     Family History: family history includes Diabetes in her father and mother; Heart disease in her maternal grandfather; Hyperlipidemia in her father and mother; Hypertension in her father and mother; Stroke in her mother. Social History:  reports that she has never smoked. She has never used smokeless tobacco. She reports that she does not drink alcohol or use drugs.     Maternal Diabetes: No Genetic Screening: Normal Maternal Ultrasounds/Referrals: Normal Fetal Ultrasounds or other Referrals:  None Maternal Substance Abuse:  No Significant Maternal Medications:  None Significant Maternal Lab Results:  Lab values include: Group B Strep negative Other Comments:  None  Review of Systems  Respiratory: Negative.   Cardiovascular: Negative.    Maternal Medical History:  Reason for admission: Rupture of membranes.   Fetal activity: Perceived fetal activity is normal.    Prenatal complications: no prenatal complications  VE-3/70/-2, vtx Dilation: 2 Effacement (%): 70 Station: -1 Exam by:: Rolene Arbouresmaine Lewis, RN Blood pressure (!) 148/85, pulse 83, temperature 98.1 F (36.7 C), temperature source Oral, resp. rate 18, height 5\' 8"  (1.727 m), weight 105.7 kg (233 lb), last menstrual period 05/02/2015. Maternal Exam:  Uterine Assessment: Contraction strength is mild.   Contraction frequency is regular.   Abdomen: Patient reports no abdominal tenderness. Estimated fetal weight is 8 lbs.   Fetal presentation: vertex  Introitus: Normal vulva. Normal vagina.  Amniotic fluid character: clear.  Pelvis: adequate for delivery.   Cervix: Cervix evaluated by digital exam.     Fetal Exam Fetal Monitor Review: Mode: ultrasound.   Baseline rate: 140.  Variability: moderate (6-25 bpm).   Pattern: accelerations present and no decelerations.    Fetal State Assessment: Category I - tracings are normal.     Physical Exam  Vitals reviewed. Constitutional: She appears well-developed and well-nourished.  Cardiovascular: Normal rate.   Respiratory: Effort normal. No respiratory distress.  GI: Soft.    Prenatal labs: ABO, Rh: --/--/O POS (10/03 0535) Antibody: NEG (10/03 0535) Rubella:  Immune RPR:   NR HBsAg:  Neg  HIV:  NR  GBS:   Neg  Assessment/Plan: IUP at 39 weeks with SROM in early/latent labor.  She is waiting for an epidural, will augment with pitocin and monitor progress.  BP is up a bit, will monitor.   Junie Engram D 02/10/2016, 8:43 AM

## 2016-02-11 ENCOUNTER — Encounter (HOSPITAL_COMMUNITY): Payer: Self-pay

## 2016-02-11 LAB — CBC
HCT: 28.8 % — ABNORMAL LOW (ref 36.0–46.0)
Hemoglobin: 9.2 g/dL — ABNORMAL LOW (ref 12.0–15.0)
MCH: 25.1 pg — ABNORMAL LOW (ref 26.0–34.0)
MCHC: 31.9 g/dL (ref 30.0–36.0)
MCV: 78.5 fL (ref 78.0–100.0)
PLATELETS: 387 10*3/uL (ref 150–400)
RBC: 3.67 MIL/uL — AB (ref 3.87–5.11)
RDW: 16.9 % — AB (ref 11.5–15.5)
WBC: 16.3 10*3/uL — AB (ref 4.0–10.5)

## 2016-02-11 MED ORDER — METHYLERGONOVINE MALEATE 0.2 MG PO TABS: 0.2000 mg | ORAL_TABLET | ORAL | Status: DC | PRN

## 2016-02-11 MED ORDER — ONDANSETRON HCL 4 MG/2ML IJ SOLN: 4.0000 mg | INTRAMUSCULAR | Status: DC | PRN

## 2016-02-11 MED ORDER — DIPHENHYDRAMINE HCL 25 MG PO CAPS: 25.0000 mg | ORAL_CAPSULE | Freq: Four times a day (QID) | ORAL | Status: DC | PRN

## 2016-02-11 MED ORDER — DIBUCAINE 1 % RE OINT: 1.0000 "application " | TOPICAL_OINTMENT | RECTAL | Status: DC | PRN

## 2016-02-11 MED ORDER — COCONUT OIL OIL: 1.0000 "application " | TOPICAL_OIL | Status: DC | PRN

## 2016-02-11 MED ORDER — METHYLERGONOVINE MALEATE 0.2 MG/ML IJ SOLN: 0.2000 mg | INTRAMUSCULAR | Status: DC | PRN

## 2016-02-11 MED ORDER — MAGNESIUM HYDROXIDE 400 MG/5ML PO SUSP: 30.0000 mL | ORAL | Status: DC | PRN

## 2016-02-11 MED ORDER — BENZOCAINE-MENTHOL 20-0.5 % EX AERO: 1.0000 "application " | INHALATION_SPRAY | CUTANEOUS | Status: DC | PRN

## 2016-02-11 MED ORDER — SIMETHICONE 80 MG PO CHEW: 80.0000 mg | CHEWABLE_TABLET | ORAL | Status: DC | PRN

## 2016-02-11 MED ORDER — MEASLES, MUMPS & RUBELLA VAC ~~LOC~~ INJ
0.5000 mL | INJECTION | Freq: Once | SUBCUTANEOUS | Status: DC
  Filled 2016-02-11: qty 0.5

## 2016-02-11 MED ORDER — SENNOSIDES-DOCUSATE SODIUM 8.6-50 MG PO TABS
2.0000 | ORAL_TABLET | ORAL | Status: DC
  Administered 2016-02-11 – 2016-02-12 (×2): 2 via ORAL
  Filled 2016-02-11 (×2): qty 2

## 2016-02-11 MED ORDER — IBUPROFEN 600 MG PO TABS
600.0000 mg | ORAL_TABLET | Freq: Four times a day (QID) | ORAL | Status: DC
  Administered 2016-02-11 – 2016-02-12 (×6): 600 mg via ORAL
  Filled 2016-02-11 (×6): qty 1

## 2016-02-11 MED ORDER — OXYCODONE HCL 5 MG PO TABS: 10.0000 mg | ORAL_TABLET | ORAL | Status: DC | PRN

## 2016-02-11 MED ORDER — COMPLETENATE 29-1 MG PO CHEW
1.0000 | CHEWABLE_TABLET | Freq: Every day | ORAL | Status: DC
  Administered 2016-02-12: 1 via ORAL
  Filled 2016-02-11: qty 1

## 2016-02-11 MED ORDER — ACETAMINOPHEN 325 MG PO TABS: 650.0000 mg | ORAL_TABLET | ORAL | Status: DC | PRN

## 2016-02-11 MED ORDER — ONDANSETRON HCL 4 MG PO TABS: 4.0000 mg | ORAL_TABLET | ORAL | Status: DC | PRN

## 2016-02-11 MED ORDER — PRENATAL MULTIVITAMIN CH
1.0000 | ORAL_TABLET | Freq: Every day | ORAL | Status: DC
  Administered 2016-02-11: 1 via ORAL
  Filled 2016-02-11: qty 1

## 2016-02-11 MED ORDER — WITCH HAZEL-GLYCERIN EX PADS: 1.0000 "application " | MEDICATED_PAD | CUTANEOUS | Status: DC | PRN

## 2016-02-11 MED ORDER — TETANUS-DIPHTH-ACELL PERTUSSIS 5-2.5-18.5 LF-MCG/0.5 IM SUSP: 0.5000 mL | Freq: Once | INTRAMUSCULAR | Status: DC

## 2016-02-11 MED ORDER — OXYCODONE HCL 5 MG PO TABS
5.0000 mg | ORAL_TABLET | ORAL | Status: DC | PRN
  Administered 2016-02-11 (×2): 5 mg via ORAL
  Filled 2016-02-11 (×2): qty 1

## 2016-02-11 MED ORDER — ZOLPIDEM TARTRATE 5 MG PO TABS: 5.0000 mg | ORAL_TABLET | Freq: Every evening | ORAL | Status: DC | PRN

## 2016-02-11 NOTE — Lactation Note (Signed)
This note was copied from a baby's chart. Lactation Consultation Note  Patient Name: Girl Dorena BodoWhitney Sine ZOXWR'UToday's Date: 02/11/2016 Reason for consult: Follow-up assessment;Difficult latch Baby gave few feeding ques and tried to latch baby. She was tongue thrusting and LC demonstrated using breast compression to help baby latch. Baby would not sustain latch and fell back asleep. Left baby STS on Mom and advised Mom to call with next feeding for assist. Mom wearing shells when LC arrived due to short nipple shafts but nipples soft/compressible at this visit. Encouraged Mom to let [facility] also assist with other ways to supplement than bottle if Mom continue to supplement.  Will await Mom's call.  Maternal Data    Feeding Feeding Type: Formula  LATCH Score/Interventions Latch: Too sleepy or reluctant, no latch achieved, no sucking elicited.     Type of Nipple: Everted at rest and after stimulation (short shaft bilateral)              Lactation Tools Discussed/Used     Consult Status Consult Status: Follow-up Date: 02/11/16 Follow-up type: In-patient    Alfred LevinsGranger, Emalie Mcwethy Ann 02/11/2016, 2:25 PM

## 2016-02-11 NOTE — Lactation Note (Signed)
This note was copied from a baby's chart. Lactation Consultation Note Mom's 2nd child. Stated her now 1134 yr old wouldn't latch/ mom has large breast w/short shaft everted nipples. Reverse pressure assist in everting for a better latch. Baby wouldn't suck. Had no interest in BF at this time. Sleepy. Baby put STS w/mom. Hand expression taught w/NO colostrum noted at this time. Shells given for mom to wear in am w/bra. Hand pump given to stimulate nipples prior to BF.  Mom encouraged to feed baby 8-12 times/24 hours and with feeding cues. Baby is small, needs to be stimulated every 2-3 hours if hasn't cued to BF. Educated about newborn behavior, STS, I&O, supply and demand. WH/LC brochure given w/resources, support groups and LC services. Patient Name: Girl Dorena BodoWhitney Hucker ZOXWR'UToday's Date: 02/11/2016 Reason for consult: Initial assessment   Maternal Data Has patient been taught Hand Expression?: Yes Does the patient have breastfeeding experience prior to this delivery?: Yes  Feeding Feeding Type: Breast Fed Length of feed: 0 min  LATCH Score/Interventions Latch: Too sleepy or reluctant, no latch achieved, no sucking elicited. Intervention(s): Skin to skin;Teach feeding cues;Waking techniques Intervention(s): Adjust position;Assist with latch;Breast massage;Breast compression  Audible Swallowing: None Intervention(s): Skin to skin;Hand expression  Type of Nipple: Everted at rest and after stimulation  Comfort (Breast/Nipple): Soft / non-tender     Hold (Positioning): Full assist, staff holds infant at breast Intervention(s): Breastfeeding basics reviewed;Support Pillows;Position options;Skin to skin  LATCH Score: 4  Lactation Tools Discussed/Used WIC Program: No   Consult Status Consult Status: Follow-up Date: 02/11/16 Follow-up type: In-patient    Kinsley Nicklaus, Diamond NickelLAURA G 02/11/2016, 2:14 AM

## 2016-02-11 NOTE — Lactation Note (Signed)
This note was copied from a baby's chart. Lactation Consultation Note  Patient Name: Loretta Ellis ZOXWR'UToday's Date: 02/11/2016 Reason for consult: Follow-up assessment Baby at 23 hr of life. RN requesting latch help. Mom has short shaft nipples and baby has a high palate. Mom stated that her older child did not latch so she gave up. She would really like to bf this baby. Applied #20 NS. After 3-4 minutes baby began short bursts of sucking. Mom has Harmony in room. She will try latching baby on demand 8+/24hr without the NS. If baby is struggling she will apply the NS and post pump. She is aware of lactation services and will call as needed.   Maternal Data    Feeding Feeding Type: Breast Fed  LATCH Score/Interventions Latch: Repeated attempts needed to sustain latch, nipple held in mouth throughout feeding, stimulation needed to elicit sucking reflex. Intervention(s): Skin to skin Intervention(s): Adjust position;Assist with latch;Breast massage;Breast compression  Audible Swallowing: A few with stimulation Intervention(s): Hand expression;Skin to skin Intervention(s): Alternate breast massage  Type of Nipple: Everted at rest and after stimulation  Comfort (Breast/Nipple): Soft / non-tender     Hold (Positioning): Full assist, staff holds infant at breast Intervention(s): Support Pillows;Position options  LATCH Score: 6  Lactation Tools Discussed/Used Tools: Nipple Shields Nipple shield size: 20   Consult Status Consult Status: Follow-up Date: 02/12/16 Follow-up type: In-patient    Rulon Eisenmengerlizabeth E Aniel Hubble 02/11/2016, 11:07 PM

## 2016-02-11 NOTE — Progress Notes (Signed)
UR chart review completed.  

## 2016-02-11 NOTE — Progress Notes (Signed)
Patient ID: Loretta Ellis, female   DOB: Oct 10, 1985, 30 y.o.   MRN: [id number] Pt doing well. Has begun noting some crampy pain - plans to take an ibuprofen. Lochia mild. No headaches ro blurry vision. Bonding well with baby - attempting breastfeeding. No complaints VSS- 130s/68-81 ABD- soft, ND, FF EXT - no Homans  A/P: PPD#1 s/p svd-stable         Routine pp care         Likely discharge to home tomorrow

## 2016-02-11 NOTE — Anesthesia Postprocedure Evaluation (Signed)
Anesthesia Post Note  Patient: Loretta Ellis  Procedure(s) Performed: * No procedures listed *  Patient location during evaluation: Mother Baby Anesthesia Type: Epidural Level of consciousness: awake and alert and oriented Pain management: pain level controlled Vital Signs Assessment: post-procedure vital signs reviewed and stable Respiratory status: spontaneous breathing, nonlabored ventilation and respiratory function stable Cardiovascular status: stable Postop Assessment: no headache and no backache     Last Vitals:  Vitals:   02/11/16 0245 02/11/16 0650  BP: 133/81 137/81  Pulse: 71 67  Resp: 18 18  Temp: 36.6 C 36.7 C    Last Pain:  Vitals:   02/11/16 0650  TempSrc: Oral  PainSc: 4    Pain Goal: Patients Stated Pain Goal: 3 (02/11/16 0650)  Pain level 2               Shauniece Kwan

## 2016-02-12 MED ORDER — OXYCODONE HCL 10 MG PO TABS: 10.0000 mg | ORAL_TABLET | ORAL | 0 refills | Status: DC | PRN

## 2016-02-12 MED ORDER — IBUPROFEN 600 MG PO TABS: 600.0000 mg | ORAL_TABLET | Freq: Four times a day (QID) | ORAL | 0 refills | Status: DC

## 2016-02-12 NOTE — Discharge Summary (Signed)
OB Discharge Summary     Patient Name: Loretta Ellis DOB: 08-Nov-1985 MRN: [id number]  Date of admission: 02/10/2016 Delivering MD: Jackelyn Knife, TODD   Date of discharge: 02/12/2016  Admitting diagnosis: 39 WEEKS ROM Intrauterine pregnancy: [redacted]w[redacted]d     Secondary diagnosis:  Active Problems:   Normal labor  Additional problems: none     Discharge diagnosis: Term Pregnancy Delivered                                                                                                Post partum procedures:none  Augmentation: Pitocin  Complications: None  Hospital course:  Onset of Labor With Vaginal Delivery     30 y.o. yo [id number] at [redacted]w[redacted]d was admitted in Latent Labor on 02/10/2016. Patient had an uncomplicated labor course as follows:  Membrane Rupture Time/Date: 4:35 AM ,02/10/2016   Intrapartum Procedures: Episiotomy: None [1]                                         Lacerations:  None [1]  Patient had a delivery of a Viable infant. 02/10/2016  Information for the patient's newborn:  Davin, Muramoto [[id number]]  Delivery Method: Vaginal, Spontaneous Delivery (Filed from Delivery Summary)    Pateint had an uncomplicated postpartum course.  She is ambulating, tolerating a regular diet, passing flatus, and urinating well. Patient is discharged home in stable condition on 02/12/16.    Physical exam  Vitals:   02/11/16 0650 02/11/16 1500 02/11/16 1735 02/12/16 0545  BP: 137/81 138/77 (!) 147/84 136/78  Pulse: 67 63 89 71  Resp: 18 18 18 16   Temp: 98.1 F (36.7 C) 97.9 F (36.6 C) 97.7 F (36.5 C) 98.3 F (36.8 C)  TempSrc: Oral Oral Oral Oral  SpO2:  98% 100%   Weight:      Height:       General: alert and cooperative Lochia: appropriate Uterine Fundus: firm  Labs: Lab Results  Component Value Date   WBC 16.3 (H) 02/11/2016   HGB 9.2 (L) 02/11/2016   HCT 28.8 (L) 02/11/2016   MCV 78.5 02/11/2016   PLT 387 02/11/2016   CMP Latest Ref Rng & Units 02/10/2016   Glucose 65 - 99 mg/dL 88  BUN 6 - 20 mg/dL <9(B)  Creatinine 2.84 - 1.00 mg/dL 1.32  Sodium 440 - 102 mmol/L 134(L)  Potassium 3.5 - 5.1 mmol/L 4.0  Chloride 101 - 111 mmol/L 108  CO2 22 - 32 mmol/L 19(L)  Calcium 8.9 - 10.3 mg/dL 8.9  Total Protein 6.5 - 8.1 g/dL 6.2(L)  Total Bilirubin 0.3 - 1.2 mg/dL 0.3  Alkaline Phos 38 - 126 U/L 179(H)  AST 15 - 41 U/L 18  ALT 14 - 54 U/L 11(L)    Discharge instruction: per After Visit Summary and "Baby and Me Booklet".  After visit meds:    Medication List    STOP taking these medications   acetaminophen 500 MG tablet Commonly known as:  TYLENOL   ondansetron  8 MG disintegrating tablet Commonly known as:  ZOFRAN ODT     TAKE these medications   ferrous sulfate 325 (65 FE) MG tablet Take 325 mg by mouth daily with breakfast.   ibuprofen 600 MG tablet Commonly known as:  ADVIL,MOTRIN Take 1 tablet (600 mg total) by mouth every 6 (six) hours.   Oxycodone HCl 10 MG Tabs Take 1 tablet (10 mg total) by mouth every 4 (four) hours as needed (pain scale > 7).   PRENATAL GUMMIES/DHA & FA 0.4-32.5 MG Chew Chew 2 each by mouth daily.       Diet: routine diet  Activity: Advance as tolerated. Pelvic rest for 6 weeks.   Outpatient follow up:6 weeks Follow up Appt:No future appointments. Follow up Visit:No Follow-up on file.  Postpartum contraception: Undecided  Newborn Data: Live born female  Birth Weight: 6 lb 0.8 oz (2745 g) APGAR: 9, 10  Baby Feeding: Breast Disposition:home with mother   02/12/2016 Oliver PilaICHARDSON,Jozeph Persing W, MD

## 2016-02-12 NOTE — Lactation Note (Signed)
This note was copied from a baby's chart. Lactation Consultation Note  Patient Name: Loretta Ellis Lachney CZYSA'YToday's Date: 02/12/2016 Reason for consult: Follow-up assessment   P2 mom had baby at breast [facility]. Baby BF with NS #20.  Mom reports seeing colostrum in shield after baby finished and detaches.  LC reviewed importance of STS and feeding baby often and on demand.  Also reviewed waking techniques and the importance of breast compression.  LC advised mom to keep diary of void/stools/feeds for pediatrician appointment.  BF support groups and outpatient services discussed with mom.  LC encouraged mom to call with concerns or BF questions.  Reviewed with patient the void/stool chart for postpartum days and also BM storage guidelines.  Mom was given an extra NS to take home.  Encouraged mom to try, (after milk has come in),  to to begin feed with NS then after a couple of minutes remove and try feeding without.  Mom seems very excited about BF as she attempted with her first baby but outcome was not successful as mom as hoped.    Maternal Data    Feeding Feeding Type: Breast Fed Length of feed: 15 min  LATCH Score/Interventions Latch: Repeated attempts needed to sustain latch, nipple held in mouth throughout feeding, stimulation needed to elicit sucking reflex. Intervention(s): Skin to skin;Teach feeding cues;Waking techniques Intervention(s): Adjust position;Breast compression;Breast massage  Audible Swallowing: A few with stimulation Intervention(s): Skin to skin;Hand expression  Type of Nipple: Everted at rest and after stimulation  Comfort (Breast/Nipple): Soft / non-tender     Hold (Positioning): Assistance needed to correctly position infant at breast and maintain latch. Intervention(s): Breastfeeding basics reviewed;Support Pillows;Position options;Skin to skin  LATCH Score: 7  Lactation Tools Discussed/Used Tools: Nipple Shields Nipple shield size:  20   Consult Status Consult Status: Complete Date: 02/12/16 Follow-up type: In-patient    Maryruth HancockKelly Suzanne [facility] 02/12/2016, 10:49 AM

## 2016-02-12 NOTE — Progress Notes (Signed)
Post Partum Day 2 Subjective: no complaints, up ad lib and tolerating PO  Objective: Blood pressure 136/78, pulse 71, temperature 98.3 F (36.8 C), temperature source Oral, resp. rate 16, height 5\' 8"  (1.727 m), weight 105.7 kg (233 lb), last menstrual period 05/02/2015, SpO2 100 %, unknown if currently breastfeeding.  Physical Exam:  General: alert and cooperative Lochia: appropriate Uterine Fundus: firm    Recent Labs  02/10/16 0535 02/11/16 0048  HGB 9.6* 9.2*  HCT 29.9* 28.8*    Assessment/Plan: Discharge home   LOS: 2 days   Kiaan Overholser W 02/12/2016, 8:57 AM

## 2016-05-29 ENCOUNTER — Emergency Department (HOSPITAL_COMMUNITY)
Admission: EM | Admit: 2016-05-29 | Discharge: 2016-05-29 | Disposition: A | Discharge status: Home / Self Care | Payer: Self-pay | Attending: Emergency Medicine | Admitting: Emergency Medicine

## 2016-05-29 ENCOUNTER — Emergency Department (HOSPITAL_COMMUNITY): Payer: Self-pay

## 2016-05-29 ENCOUNTER — Encounter (HOSPITAL_COMMUNITY): Payer: Self-pay | Admitting: Emergency Medicine

## 2016-05-29 DIAGNOSIS — S39012A Strain of muscle, fascia and tendon of lower back, initial encounter: Secondary | ICD-10-CM | POA: Insufficient documentation

## 2016-05-29 DIAGNOSIS — S29019A Strain of muscle and tendon of unspecified wall of thorax, initial encounter: Secondary | ICD-10-CM

## 2016-05-29 DIAGNOSIS — S300XXA Contusion of lower back and pelvis, initial encounter: Secondary | ICD-10-CM

## 2016-05-29 DIAGNOSIS — Y999 Unspecified external cause status: Secondary | ICD-10-CM | POA: Insufficient documentation

## 2016-05-29 DIAGNOSIS — Y9389 Activity, other specified: Secondary | ICD-10-CM | POA: Insufficient documentation

## 2016-05-29 DIAGNOSIS — S0990XA Unspecified injury of head, initial encounter: Secondary | ICD-10-CM

## 2016-05-29 DIAGNOSIS — S161XXA Strain of muscle, fascia and tendon at neck level, initial encounter: Secondary | ICD-10-CM | POA: Insufficient documentation

## 2016-05-29 DIAGNOSIS — Y9289 Other specified places as the place of occurrence of the external cause: Secondary | ICD-10-CM | POA: Insufficient documentation

## 2016-05-29 DIAGNOSIS — W000XXA Fall on same level due to ice and snow, initial encounter: Secondary | ICD-10-CM | POA: Insufficient documentation

## 2016-05-29 DIAGNOSIS — R51 Headache: Secondary | ICD-10-CM | POA: Insufficient documentation

## 2016-05-29 LAB — I-STAT BETA HCG BLOOD, ED (MC, WL, AP ONLY): I-stat hCG, quantitative: <5 m[IU]/mL (ref <5)

## 2016-05-29 LAB — BASIC METABOLIC PANEL
ANION GAP: 8 (ref 5–15)
BUN: 8 mg/dL (ref 6–20)
CALCIUM: 9.4 mg/dL (ref 8.9–10.3)
CO2: 23 mmol/L (ref 22–32)
Chloride: 106 mmol/L (ref 101–111)
Creatinine, Ser: 0.73 mg/dL (ref 0.44–1.00)
Glucose, Bld: 90 mg/dL (ref 65–99)
Potassium: 4 mmol/L (ref 3.5–5.1)
Sodium: 137 mmol/L (ref 135–145)

## 2016-05-29 LAB — CBC WITH DIFFERENTIAL/PLATELET
BASOS ABS: 0 10*3/uL (ref 0.0–0.1)
BASOS PCT: 0 %
Eosinophils Absolute: 0.3 10*3/uL (ref 0.0–0.7)
Eosinophils Relative: 5 %
HEMATOCRIT: 37.2 % (ref 36.0–46.0)
HEMOGLOBIN: 11.6 g/dL — AB (ref 12.0–15.0)
Lymphocytes Relative: 24 %
Lymphs Abs: 1.5 10*3/uL (ref 0.7–4.0)
MCH: 25.5 pg — ABNORMAL LOW (ref 26.0–34.0)
MCHC: 31.2 g/dL (ref 30.0–36.0)
MCV: 81.8 fL (ref 78.0–100.0)
MONO ABS: 0.4 10*3/uL (ref 0.1–1.0)
Monocytes Relative: 6 %
NEUTROS ABS: 4.2 10*3/uL (ref 1.7–7.7)
NEUTROS PCT: 65 %
Platelets: 396 10*3/uL (ref 150–400)
RBC: 4.55 MIL/uL (ref 3.87–5.11)
RDW: 17.6 % — AB (ref 11.5–15.5)
WBC: 6.4 10*3/uL (ref 4.0–10.5)

## 2016-05-29 MED ORDER — HYDROCODONE-ACETAMINOPHEN 5-325 MG PO TABS
2.0000 | ORAL_TABLET | Freq: Once | ORAL | Status: AC
  Administered 2016-05-29: 1 via ORAL
  Filled 2016-05-29: qty 2

## 2016-05-29 MED ORDER — HYDROCODONE-ACETAMINOPHEN 5-325 MG PO TABS: 1.0000 | ORAL_TABLET | Freq: Four times a day (QID) | ORAL | 0 refills | Status: DC | PRN

## 2016-05-29 NOTE — Discharge Instructions (Signed)
Ibuprofen 600 mg every 6 hours as needed for pain. Hydrocodone as prescribed as needed for pain not relieved with ibuprofen.  Follow-up with your primary Dr. if not improving in the next week, and return to the ER if you develop worsening headache, worsening neck pain, weakness, numbness, or other new and concerning symptoms.

## 2016-05-29 NOTE — ED Notes (Signed)
Per [facility], pt got out of car, slipped on ice, hit the back of her head. Unknown if LOC. Pt grunts and moans, slow to respond, pt says her name is Loretta Ellis, and shes 30.

## 2016-05-29 NOTE — ED Provider Notes (Signed)
[facility]-EMERGENCY DEPT Provider Note   CSN: [id number] Arrival date & time: 05/29/16  1116     History   Chief Complaint Chief Complaint  Patient presents with  . Fall    HPI Loretta Ellis is a 31 y.o. female.  Patient is a 31 year old female with no significant past medical history. She presents for evaluation after a fall. She was getting out of her car when she slipped on ice and fell backwards and hit the back of her head on the ground. She is complaining of pain in her neck, head, upper and lower back. There was unknown loss of consciousness. She appeared confused when transported by EMS. She was immobilized with a collar and long board.   The history is provided by the patient.  Fall  This is a new problem. The current episode started less than 1 hour ago. The problem occurs constantly. The problem has not changed since onset.Nothing aggravates the symptoms. Nothing relieves the symptoms. She has tried nothing for the symptoms. The treatment provided no relief.    History reviewed. No pertinent past medical history.  There are no active problems to display for this patient.   History reviewed. No pertinent surgical history.  OB History    No data available       Home Medications    Prior to Admission medications   Not on File    Family History No family history on file.  Social History Social History  Substance Use Topics  . Smoking status: Not on file  . Smokeless tobacco: Not on file  . Alcohol use Not on file     Allergies   Patient has no known allergies.   Review of Systems Review of Systems  All other systems reviewed and are negative.    Physical Exam Updated Vital Signs BP 123/74   Pulse (!) 59   Temp 99 F (37.2 C) (Oral)   Resp 12   Ht 5\' 8"  (1.727 m)   Wt 185 lb (83.9 kg)   SpO2 100%   BMI 28.13 kg/m   Physical Exam  Constitutional: She is oriented to person, place, and time. She appears well-developed and  well-nourished. No distress.  Patient appears somewhat anxious and tearful. She is slow to respond to questions.  HENT:  Head: Normocephalic and atraumatic.  Mouth/Throat: Oropharynx is clear and moist.  Eyes: EOM are normal. Pupils are equal, round, and reactive to light.  Neck: Normal range of motion. Neck supple.  There is tenderness in the soft tissues of the cervical region. There is no bony tenderness or step-off.  Cardiovascular: Normal rate and regular rhythm.  Exam reveals no gallop and no friction rub.   No murmur heard. Pulmonary/Chest: Effort normal and breath sounds normal. No respiratory distress. She has no wheezes.  Abdominal: Soft. Bowel sounds are normal. She exhibits no distension. There is no tenderness.  Musculoskeletal: Normal range of motion.  There is tenderness to palpation in the lower thoracic and upper lumbar region. There is no bony tenderness or step-off.  Neurological: She is alert and oriented to person, place, and time. No cranial nerve deficit. She exhibits normal muscle tone. Coordination normal.  Sensation and motor intact in all extremities. Coordination is normal  Skin: Skin is warm and dry. She is not diaphoretic.  Nursing note and vitals reviewed.    ED Treatments / Results  Labs (all labs ordered are listed, but only abnormal results are displayed) Labs Reviewed  CBC WITH DIFFERENTIAL/PLATELET -  Abnormal; Notable for the following:       Result Value   Hemoglobin 11.6 (*)    MCH 25.5 (*)    RDW 17.6 (*)    All other components within normal limits  BASIC METABOLIC PANEL  I-STAT BETA HCG BLOOD, ED ([facility], [facility], [facility] ONLY)    EKG  EKG Interpretation None       Radiology Dg Thoracic Spine 2 View  Result Date: 05/29/2016 CLINICAL DATA:  Slipped on ice, hit back of the head EXAM: THORACIC SPINE 2 VIEWS COMPARISON:  None. FINDINGS: Three views of thoracic spine submitted. No acute fracture or subluxation. Alignment, disc spaces and vertebral  body heights are preserved. IMPRESSION: Negative. Electronically Signed   By: Natasha Mead M.D.   On: 05/29/2016 13:20   Dg Lumbar Spine Complete  Result Date: 05/29/2016 CLINICAL DATA:  Fall. EXAM: LUMBAR SPINE - COMPLETE 4+ VIEW COMPARISON:  None. FINDINGS: There is no evidence of lumbar spine fracture. Alignment is normal. Intervertebral disc spaces are maintained. IMPRESSION: Negative. Electronically Signed   By: Signa Kell M.D.   On: 05/29/2016 13:22   Ct Head Wo Contrast  Result Date: 05/29/2016 CLINICAL DATA:  Slipped on ice, hit the back of the head. Unknown loss consciousness. EXAM: CT HEAD WITHOUT CONTRAST CT CERVICAL SPINE WITHOUT CONTRAST TECHNIQUE: Multidetector CT imaging of the head and cervical spine was performed following the standard protocol without intravenous contrast. Multiplanar CT image reconstructions of the cervical spine were also generated. COMPARISON:  None. FINDINGS: CT HEAD FINDINGS B Brain: No evidence of acute infarction, hemorrhage, hydrocephalus, extra-axial collection or mass lesion/mass effect. Vascular: No hyperdense vessel or unexpected calcification. Skull: No osseous abnormality. Sinuses/Orbits: Visualized paranasal sinuses are clear. Visualized mastoid sinuses are clear. Visualized orbits demonstrate no focal abnormality. Other: None CT CERVICAL SPINE FINDINGS Alignment: Normal. Skull base and vertebrae: No acute fracture. No primary bone lesion or focal pathologic process. Soft tissues and spinal canal: No prevertebral fluid or swelling. No visible canal hematoma. Disc levels: Disc spaces are maintained. Mild broad-based disc bulge at C5-6 with bilateral uncovertebral degenerative changes. No cervical spine foraminal stenosis. Other: No fluid collection or hematoma. IMPRESSION: 1. No acute intracranial pathology. 2. No acute osseous injury of the cervical spine. Electronically Signed   By: Elige Ko   On: 05/29/2016 12:55   Ct Cervical Spine Wo  Contrast  Result Date: 05/29/2016 CLINICAL DATA:  Slipped on ice, hit the back of the head. Unknown loss consciousness. EXAM: CT HEAD WITHOUT CONTRAST CT CERVICAL SPINE WITHOUT CONTRAST TECHNIQUE: Multidetector CT imaging of the head and cervical spine was performed following the standard protocol without intravenous contrast. Multiplanar CT image reconstructions of the cervical spine were also generated. COMPARISON:  None. FINDINGS: CT HEAD FINDINGS B Brain: No evidence of acute infarction, hemorrhage, hydrocephalus, extra-axial collection or mass lesion/mass effect. Vascular: No hyperdense vessel or unexpected calcification. Skull: No osseous abnormality. Sinuses/Orbits: Visualized paranasal sinuses are clear. Visualized mastoid sinuses are clear. Visualized orbits demonstrate no focal abnormality. Other: None CT CERVICAL SPINE FINDINGS Alignment: Normal. Skull base and vertebrae: No acute fracture. No primary bone lesion or focal pathologic process. Soft tissues and spinal canal: No prevertebral fluid or swelling. No visible canal hematoma. Disc levels: Disc spaces are maintained. Mild broad-based disc bulge at C5-6 with bilateral uncovertebral degenerative changes. No cervical spine foraminal stenosis. Other: No fluid collection or hematoma. IMPRESSION: 1. No acute intracranial pathology. 2. No acute osseous injury of the cervical spine. Electronically Signed  By: Elige KoHetal  Patel   On: 05/29/2016 12:55    Procedures Procedures (including critical care time)  Medications Ordered in ED Medications  HYDROcodone-acetaminophen (NORCO/VICODIN) 5-325 MG per tablet 2 tablet (1 tablet Oral Given 05/29/16 1412)     Initial Impression / Assessment and Plan / ED Course  I have reviewed the triage vital signs and the nursing notes.  Pertinent labs & imaging results that were available during my care of the patient were reviewed by me and considered in my medical decision making (see chart for details).      CT of the head and neck are negative. Films of the T and L-spine are negative. She will be treated for a concussion, cervical strain, thoracic and lumbar strains, and is to follow-up as needed for any problems.  Final Clinical Impressions(s) / ED Diagnoses   Final diagnoses:  None    New Prescriptions New Prescriptions   No medications on file     Geoffery Lyonsouglas Yulianna Folse, MD 05/29/16 1528

## 2016-05-29 NOTE — Progress Notes (Signed)
Orthopedic Tech Progress Note Patient Details:  Vance PeperWhitney S Roderick 1985-10-20 [id number]  Patient ID: Vance PeperWhitney S Mccullars, female   DOB: 1985-10-20, 31 y.o.   MRN: [id number]   Nikki DomCrawford, Joanthan Hlavacek 05/29/2016, 11:41 AM Made level 2 trauma visit

## 2016-05-29 NOTE — ED Notes (Signed)
Pt family at bedside

## 2016-05-31 ENCOUNTER — Encounter (HOSPITAL_COMMUNITY): Payer: Self-pay

## 2016-11-23 ENCOUNTER — Inpatient Hospital Stay (HOSPITAL_COMMUNITY)
Admission: AD | Admit: 2016-11-23 | Discharge: 2016-11-23 | Disposition: A | Discharge status: Home / Self Care | Payer: Medicaid Other | Source: Ambulatory Visit | Attending: Obstetrics and Gynecology | Admitting: Obstetrics and Gynecology

## 2016-11-23 ENCOUNTER — Emergency Department (HOSPITAL_COMMUNITY)
Admission: EM | Admit: 2016-11-23 | Discharge: 2016-11-23 | Discharge status: Against Medical Advice | Payer: Medicaid Other

## 2016-11-23 ENCOUNTER — Encounter (HOSPITAL_COMMUNITY): Payer: Self-pay | Admitting: *Deleted

## 2016-11-23 DIAGNOSIS — Z3202 Encounter for pregnancy test, result negative: Secondary | ICD-10-CM | POA: Insufficient documentation

## 2016-11-23 DIAGNOSIS — N938 Other specified abnormal uterine and vaginal bleeding: Secondary | ICD-10-CM | POA: Diagnosis not present

## 2016-11-23 DIAGNOSIS — N946 Dysmenorrhea, unspecified: Secondary | ICD-10-CM | POA: Diagnosis not present

## 2016-11-23 DIAGNOSIS — N939 Abnormal uterine and vaginal bleeding, unspecified: Secondary | ICD-10-CM | POA: Diagnosis present

## 2016-11-23 LAB — URINALYSIS, ROUTINE W REFLEX MICROSCOPIC
BILIRUBIN URINE: NEGATIVE
Glucose, UA: NEGATIVE mg/dL
KETONES UR: NEGATIVE mg/dL
LEUKOCYTES UA: NEGATIVE
NITRITE: NEGATIVE
PH: 8 (ref 5.0–8.0)
Protein, ur: 30 mg/dL — AB
SPECIFIC GRAVITY, URINE: 1.027 (ref 1.005–1.030)

## 2016-11-23 LAB — CBC
HCT: 34.5 % — ABNORMAL LOW (ref 36.0–46.0)
Hemoglobin: 11 g/dL — ABNORMAL LOW (ref 12.0–15.0)
MCH: 26.7 pg (ref 26.0–34.0)
MCHC: 31.9 g/dL (ref 30.0–36.0)
MCV: 83.7 fL (ref 78.0–100.0)
PLATELETS: 392 10*3/uL (ref 150–400)
RBC: 4.12 MIL/uL (ref 3.87–5.11)
RDW: 15.6 % — AB (ref 11.5–15.5)
WBC: 9.8 10*3/uL (ref 4.0–10.5)

## 2016-11-23 LAB — POCT PREGNANCY, URINE: Preg Test, Ur: NEGATIVE

## 2016-11-23 MED ORDER — IBUPROFEN 600 MG PO TABS: 600.0000 mg | ORAL_TABLET | Freq: Four times a day (QID) | ORAL | 1 refills | Status: DC | PRN

## 2016-11-23 MED ORDER — KETOROLAC TROMETHAMINE 60 MG/2ML IM SOLN
60.0000 mg | Freq: Once | INTRAMUSCULAR | Status: AC
  Administered 2016-11-23: 60 mg via INTRAMUSCULAR
  Filled 2016-11-23: qty 2

## 2016-11-23 NOTE — MAU Note (Signed)
Urine in the lab  

## 2016-11-23 NOTE — MAU Provider Note (Signed)
Chief Complaint:  Vaginal Bleeding and Abdominal Pain   First Provider Initiated Contact with Patient 11/23/16 2140       HPI: Loretta Ellis is a 31 y.o. [id number] who presents to maternity admissions reporting heavy period bleeding, early onset of menses, and severe pelvic pain which comes and goes.  Usually has heavy periods but not this heavy.. She reports vaginal bleeding, but no vaginal itching/burning, urinary symptoms, h/a, dizziness, n/v, or fever/chills.    Vaginal Bleeding  The patient's primary symptoms include pelvic pain and vaginal bleeding. The patient's pertinent negatives include no genital itching, genital lesions or genital odor. The current episode started in the past 7 days. The problem occurs intermittently. The pain is severe. The problem affects both sides. She is not pregnant. Associated symptoms include abdominal pain. Pertinent negatives include no back pain, chills, constipation, diarrhea (but has loose stools), fever, nausea or vomiting. The vaginal discharge was bloody. The vaginal bleeding is heavier than menses. She has been passing clots. She has not been passing tissue. Nothing aggravates the symptoms. She has tried acetaminophen for the symptoms. The treatment provided no relief. No, her partner does not have an STD.  Abdominal Pain  This is a recurrent problem. The current episode started in the past 7 days. The onset quality is gradual. The problem occurs intermittently. The problem has been unchanged. The pain is severe. The quality of the pain is cramping. The abdominal pain does not radiate. Pertinent negatives include no constipation, diarrhea (but has loose stools), fever, nausea or vomiting. Nothing aggravates the pain. The pain is relieved by nothing. She has tried acetaminophen for the symptoms. The treatment provided no relief.    RN note: Been having really bad, sharp pains in RLQ.  Cycle started wk ago Sunday (earlier than she expected), started off as  light  Pink, now is filling up pads, changing more than once an hour.  Got heavy on Wed.  Past Medical History: Past Medical History:  Diagnosis Date  . Anemia   . Chlamydia   . Gonorrhea   . Heart murmur   . No pertinent past medical history   . Pregnancy induced hypertension     Past obstetric history: OB History  Gravida Para Term Preterm AB Living  4 2 2  0 2 2  SAB TAB Ectopic Multiple Live Births  0 0 0   2    # Outcome Date GA Lbr Len/2nd Weight Sex Delivery Anes PTL Lv  4 Term 02/10/16 [redacted]w[redacted]d 18:00 / 00:25 6 lb 0.8 oz (2.745 kg) F Vag-Spont EPI  LIV  3 Term 05/29/11 [redacted]w[redacted]d 22:18 / 00:57 5 lb 13.8 oz (2.659 kg) F Vag-Spont EPI  LIV     Birth Comments: WNL  2 AB           1 AB               Past Surgical History: Past Surgical History:  Procedure Laterality Date  . ADENOIDECTOMY    . EAR TUBE REMOVAL    . TONSILLECTOMY      Family History: Family History  Problem Relation Age of Onset  . Diabetes Mother   . Hyperlipidemia Mother   . Hypertension Mother   . Stroke Mother   . Diabetes Father   . Hyperlipidemia Father   . Hypertension Father   . Heart disease Maternal Grandfather   . Anesthesia problems Neg Hx   . Hypotension Neg Hx   . Malignant hyperthermia Neg Hx   .  Pseudochol deficiency Neg Hx     Social History: Social History  Substance Use Topics  . Smoking status: Never Smoker  . Smokeless tobacco: Never Used  . Alcohol use No    Allergies: No Known Allergies  Meds:  No prescriptions prior to admission.    I have reviewed patient's Past Medical Hx, Surgical Hx, Family Hx, Social Hx, medications and allergies.  ROS:  Review of Systems  Constitutional: Negative for chills and fever.  Gastrointestinal: Positive for abdominal pain. Negative for constipation, diarrhea (but has loose stools), nausea and vomiting.  Genitourinary: Positive for pelvic pain and vaginal bleeding.  Musculoskeletal: Negative for back pain.   Other systems  negative     Physical Exam  Patient Vitals for the past 24 hrs:  BP Temp Temp src Pulse Resp SpO2 Weight  11/23/16 1724 (!) 152/85 98.9 F (37.2 C) Oral 81 18 100 % 211 lb 8 oz (95.9 kg)   Constitutional: Well-developed, well-nourished female in no acute distress.  Cardiovascular: normal rate and rhythm, no ectopy audible, S1 & S2 heard, no murmur Respiratory: normal effort, no distress. Lungs CTAB with no wheezes or crackles GI: Abd soft, non-tender.  Nondistended.  No rebound, No guarding.  Bowel Sounds audible  MS: Extremities nontender, no edema, normal ROM Neurologic: Alert and oriented x 4.   Grossly nonfocal. GU: Neg CVAT. Skin:  Warm and Dry Psych:  Affect appropriate.  PELVIC EXAM: Cervix pink, visually closed, without lesion, small amount bloody discharge, vaginal walls and external genitalia normal Bimanual exam: Cervix firm, anterior, neg CMT, uterus mildly tender, nonenlarged, adnexa without tenderness, enlargement, or mass    Labs: Results for orders placed or performed during the hospital encounter of 11/23/16 (from the past 24 hour(s))  Urinalysis, Routine w reflex microscopic     Status: Abnormal   Collection Time: 11/23/16  4:55 PM  Result Value Ref Range   Color, Urine YELLOW YELLOW   APPearance HAZY (A) CLEAR   Specific Gravity, Urine 1.027 1.005 - 1.030   pH 8.0 5.0 - 8.0   Glucose, UA NEGATIVE NEGATIVE mg/dL   Hgb urine dipstick MODERATE (A) NEGATIVE   Bilirubin Urine NEGATIVE NEGATIVE   Ketones, ur NEGATIVE NEGATIVE mg/dL   Protein, ur 30 (A) NEGATIVE mg/dL   Nitrite NEGATIVE NEGATIVE   Leukocytes, UA NEGATIVE NEGATIVE   RBC / HPF 0-5 0 - 5 RBC/hpf   WBC, UA 0-5 0 - 5 WBC/hpf   Bacteria, UA RARE (A) NONE SEEN   Squamous Epithelial / LPF 0-5 (A) NONE SEEN   Mucous PRESENT   Pregnancy, urine POC     Status: None   Collection Time: 11/23/16  5:35 PM  Result Value Ref Range   Preg Test, Ur NEGATIVE NEGATIVE  CBC     Status: Abnormal   Collection  Time: 11/23/16  6:02 PM  Result Value Ref Range   WBC 9.8 4.0 - 10.5 K/uL   RBC 4.12 3.87 - 5.11 MIL/uL   Hemoglobin 11.0 (L) 12.0 - 15.0 g/dL   HCT 98.134.5 (L) 19.136.0 - 47.846.0 %   MCV 83.7 78.0 - 100.0 fL   MCH 26.7 26.0 - 34.0 pg   MCHC 31.9 30.0 - 36.0 g/dL   RDW 29.515.6 (H) 62.111.5 - 30.815.5 %   Platelets 392 150 - 400 K/uL   --/--/O POS (10/03 0535)  Imaging:  No results found.  MAU Course/MDM: I have ordered labs as follows: CBC, UA Imaging ordered: none Results reviewed.   Hemoglobin  is stable since 5 mos ago, normal WBC  Consult Dr Mesinger. With presentation, history and exam findings and lab results .   Treatments in MAU included Toradol.   Pt stable at time of discharge.  Assessment: Dysfunctional Uterine Bleeding Dysmenorrhea  Plan: Discharge home Recommend Switch from Tylenol to Motrin Rx sent for Motrin for dysmenorrhea   Encouraged to return here or to other Urgent Care/ED if she develops worsening of symptoms, increase in pain, fever, or other concerning symptoms.   Wynelle Bourgeois CNM, MSN Certified Nurse-Midwife 11/23/2016 9:40 PM

## 2016-11-23 NOTE — Discharge Instructions (Signed)
Dysfunctional Uterine Bleeding Dysfunctional uterine bleeding is abnormal bleeding from the uterus. Dysfunctional uterine bleeding includes:  A period that comes earlier or later than usual.  A period that is lighter, heavier, or has blood clots.  Bleeding between periods.  Skipping one or more periods.  Bleeding after sexual intercourse.  Bleeding after menopause.  Follow these instructions at home: Pay attention to any changes in your symptoms. Follow these instructions to help with your condition: Eating and drinking  Eat well-balanced meals. Include foods that are high in iron, such as liver, meat, shellfish, green leafy vegetables, and eggs.  If you become constipated: ? Drink plenty of water. ? Eat fruits and vegetables that are high in water and fiber, such as spinach, carrots, raspberries, apples, and mango. Medicines  Take over-the-counter and prescription medicines only as told by your health care provider.  Do not change medicines without talking with your health care provider.  Aspirin or medicines that contain aspirin may make the bleeding worse. Do not take those medicines: ? During the week before your period. ? During your period.  If you were prescribed iron pills, take them as told by your health care provider. Iron pills help to replace iron that your body loses because of this condition. Activity  If you need to change your sanitary pad or tampon more than one time every 2 hours: ? Lie in bed with your feet raised (elevated). ? Place a cold pack on your lower abdomen. ? Rest as much as possible until the bleeding stops or slows down.  Do not try to lose weight until the bleeding has stopped and your blood iron level is back to normal. Other Instructions  For two months, write down: ? When your period starts. ? When your period ends. ? When any abnormal bleeding occurs. ? What problems you notice.  Keep all follow up visits as told by your health  care provider. This is important. Contact a health care provider if:  You get light-headed or weak.  You have nausea and vomiting.  You cannot eat or drink without vomiting.  You feel dizzy or have diarrhea while you are taking medicines.  You are taking birth control pills or hormones, and you want to change them or stop taking them. Get help right away if:  You develop a fever or chills.  You need to change your sanitary pad or tampon more than one time per hour.  Your bleeding becomes heavier, or your flow contains clots more often.  You develop pain in your abdomen.  You lose consciousness.  You develop a rash. This information is not intended to replace advice given to you by your health care provider. Make sure you discuss any questions you have with your health care provider. Document Released: 04/23/2000 Document Revised: 10/02/2015 Document Reviewed: 07/22/2014 Elsevier Interactive Patient Education  2018 [employer]   Dysmenorrhea Dysmenorrhea means painful cramps during your period (menstrual period). You will have pain in your lower belly (abdomen). The pain is caused by the tightening (contracting) of the muscles of the womb (uterus). The pain may be mild or very bad. With this condition, you may:  Have a headache.  Feel sick to your stomach (nauseous).  Throw up (vomit).  Have lower back pain.  Follow these instructions at home: Helping pain and cramping  Put heat on your lower back or belly when you have pain or cramps. Use the heat source that your doctor tells you to use. ? Place  a towel between your skin and the heat. ? Leave the heat on for 20-30 minutes. ? Remove the heat if your skin turns bright red. This is especially important if you cannot feel pain, heat, or cold. ? Do not have a heating pad on during sleep.  Do aerobic exercises. These include walking, swimming, or biking. These may help with cramps.  Massage your lower back or belly.  This may help lessen pain. General instructions  Take over-the-counter and prescription medicines only as told by your doctor.  Do not drive or use heavy machinery while taking prescription pain medicine.  Avoid alcohol and caffeine during and right before your period. These can make cramps worse.  Do not use any products that have nicotine or tobacco. These include cigarettes and e-cigarettes. If you need help quitting, ask your doctor.  Keep all follow-up visits as told by your doctor. This is important. Contact a doctor if:  You have pain that gets worse.  You have pain that does not get better with medicine.  You have pain during sex.  You feel sick to your stomach or you throw up during your period, and medicine does not help. Get help right away if:  You pass out (faint). Summary  Dysmenorrhea means painful cramps during your period (menstrual period).  Put heat on your lower back or belly when you have pain or cramps.  Do exercises like walking, swimming, or biking to help with cramps.  Contact a doctor if you have pain during sex. This information is not intended to replace advice given to you by your health care provider. Make sure you discuss any questions you have with your health care provider. Document Released: 07/23/2008 Document Revised: 05/13/2016 Document Reviewed: 05/13/2016 Elsevier Interactive Patient Education  2017 [employer]

## 2016-11-23 NOTE — MAU Note (Addendum)
Been having really bad, sharp pains in RLQ.  Cycle started wk ago Sunday (earlier than she expected), started off as light  Pink, now is filling up pads, changing more than once an hour.  Got heavy on Wed.

## 2017-10-08 IMAGING — DX DG LUMBAR SPINE COMPLETE 4+V
5 series · 5 of 5 positions shown · non-contrast
Comparison: None.

CLINICAL DATA: Fall.

EXAM:
LUMBAR SPINE - COMPLETE 4+ VIEW

[l-spine ap]
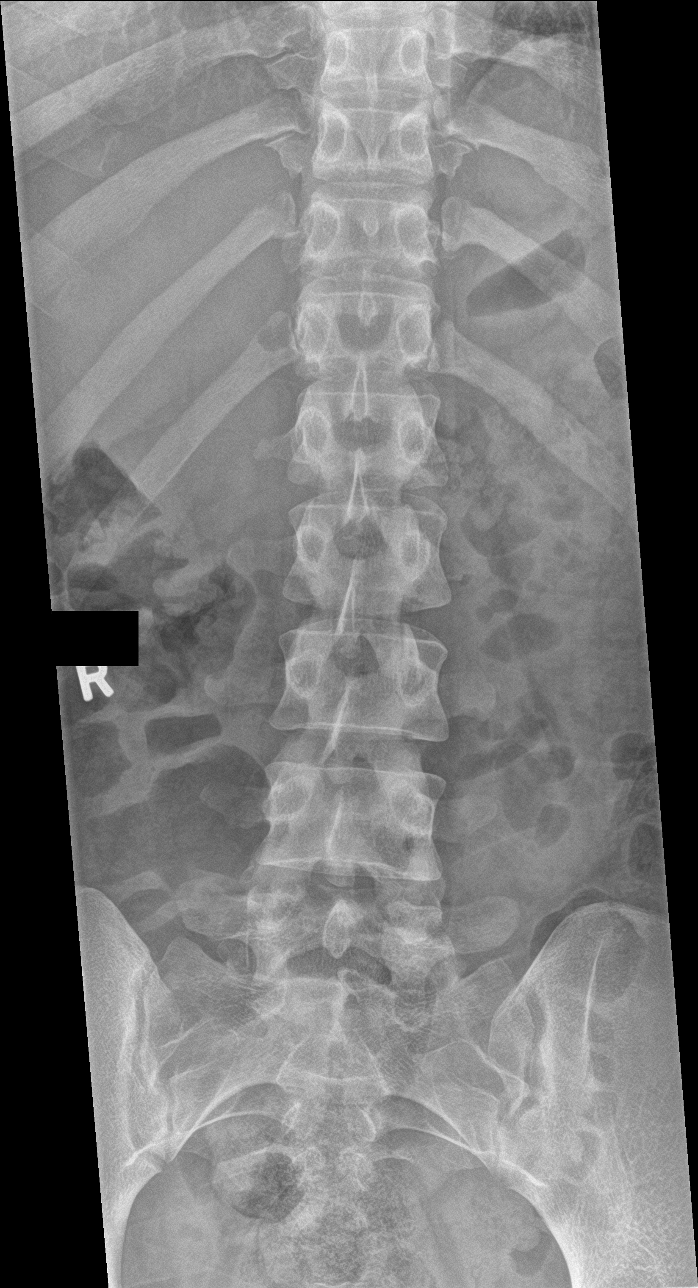

[l-spine obl (1 of 2)]
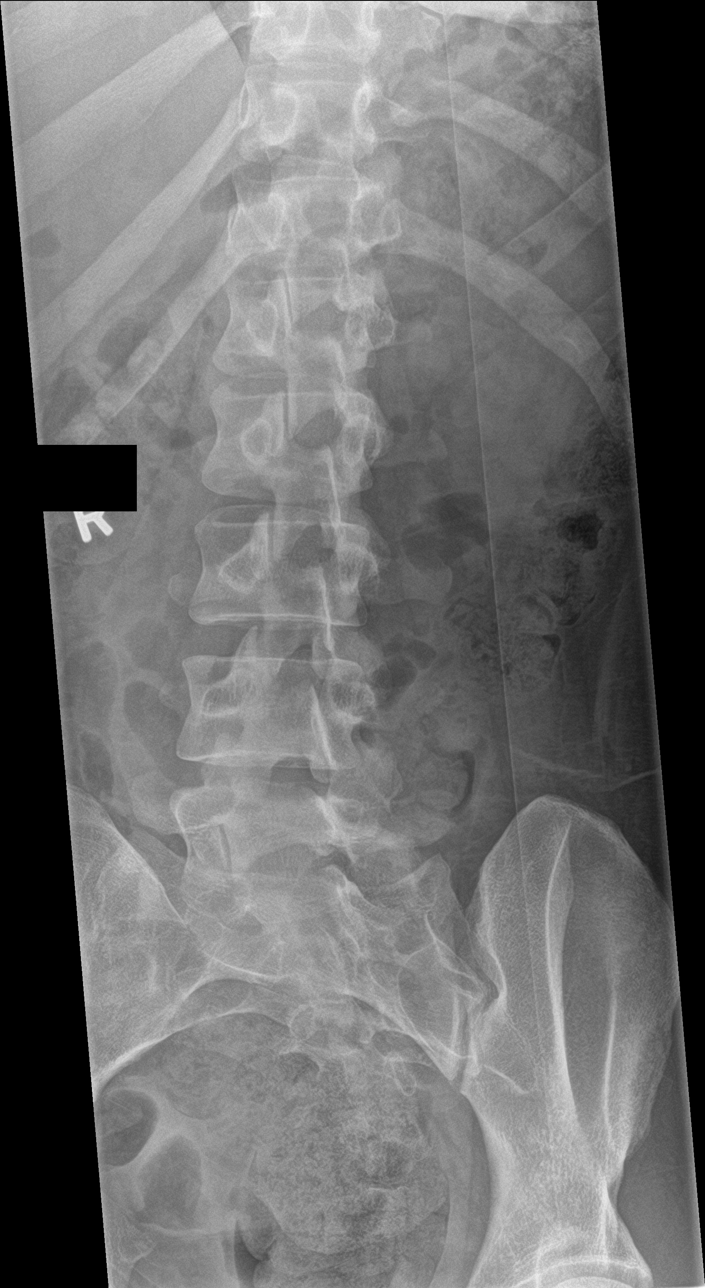

[l-spine obl (2 of 2)]
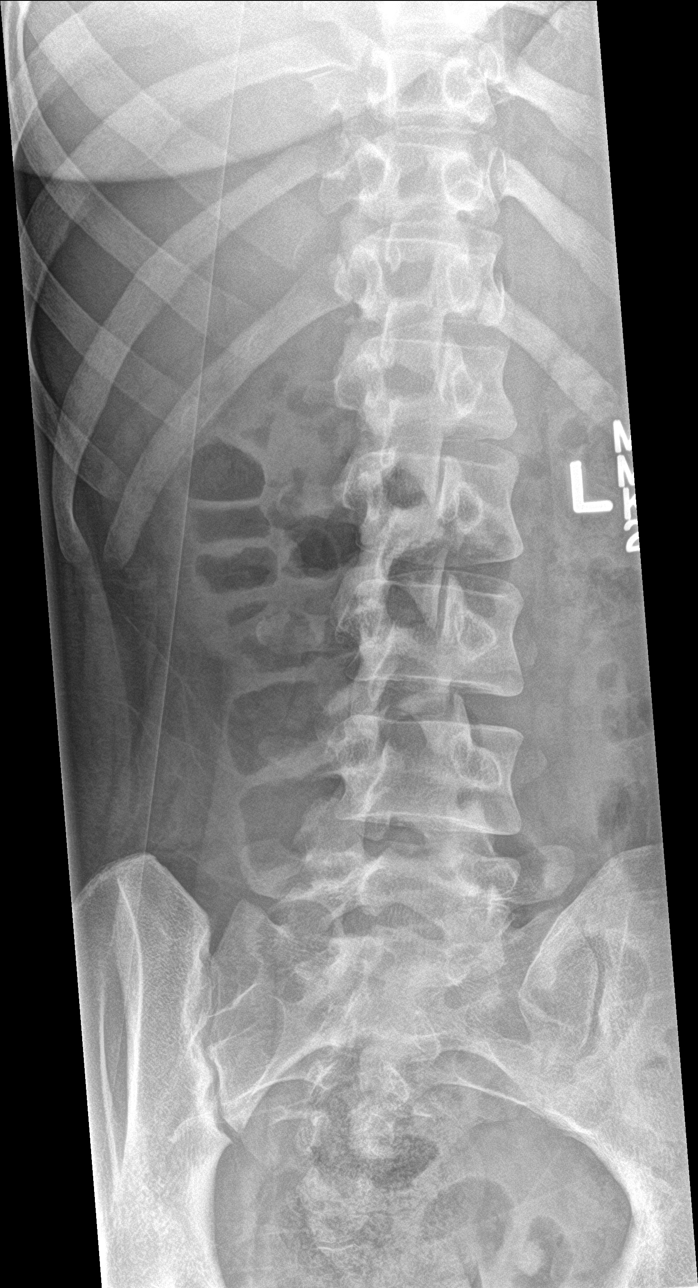

[l-spine lat]
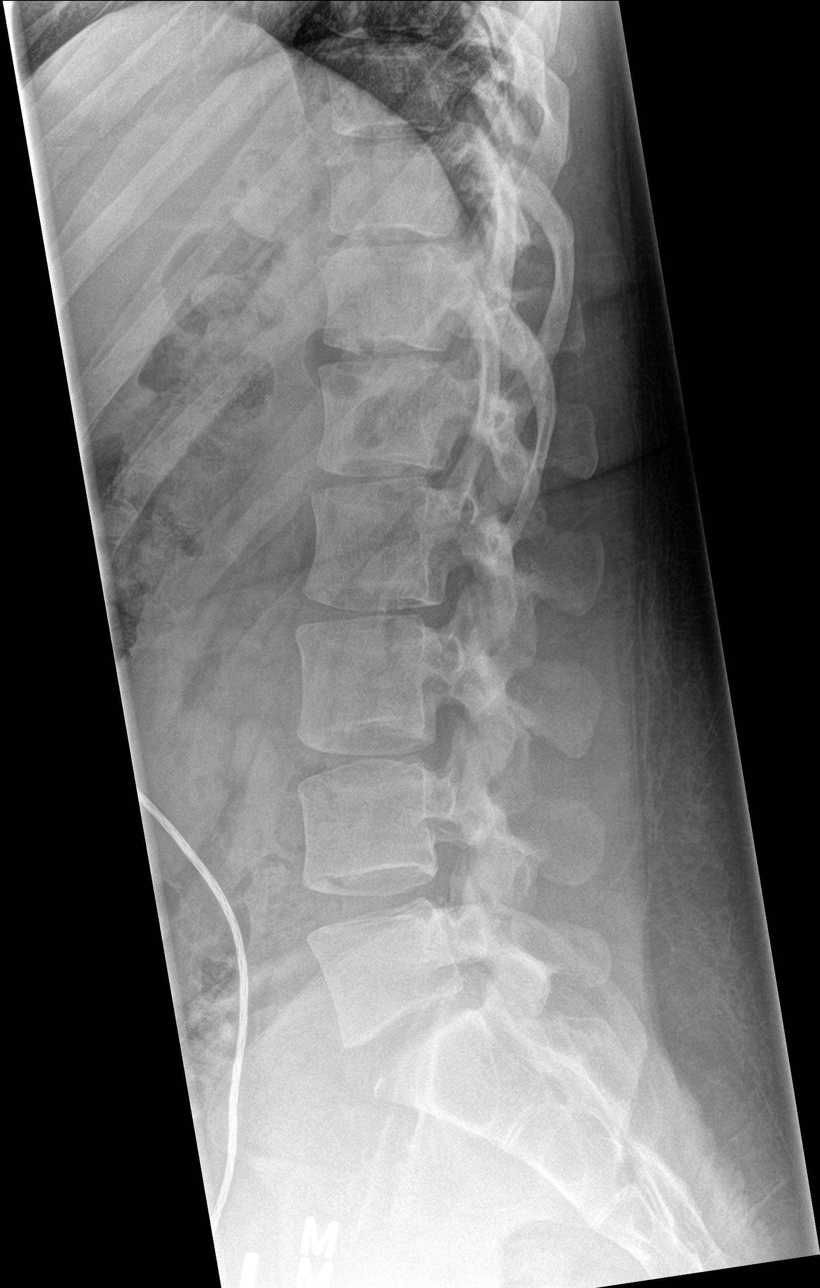

[l-spine spot]
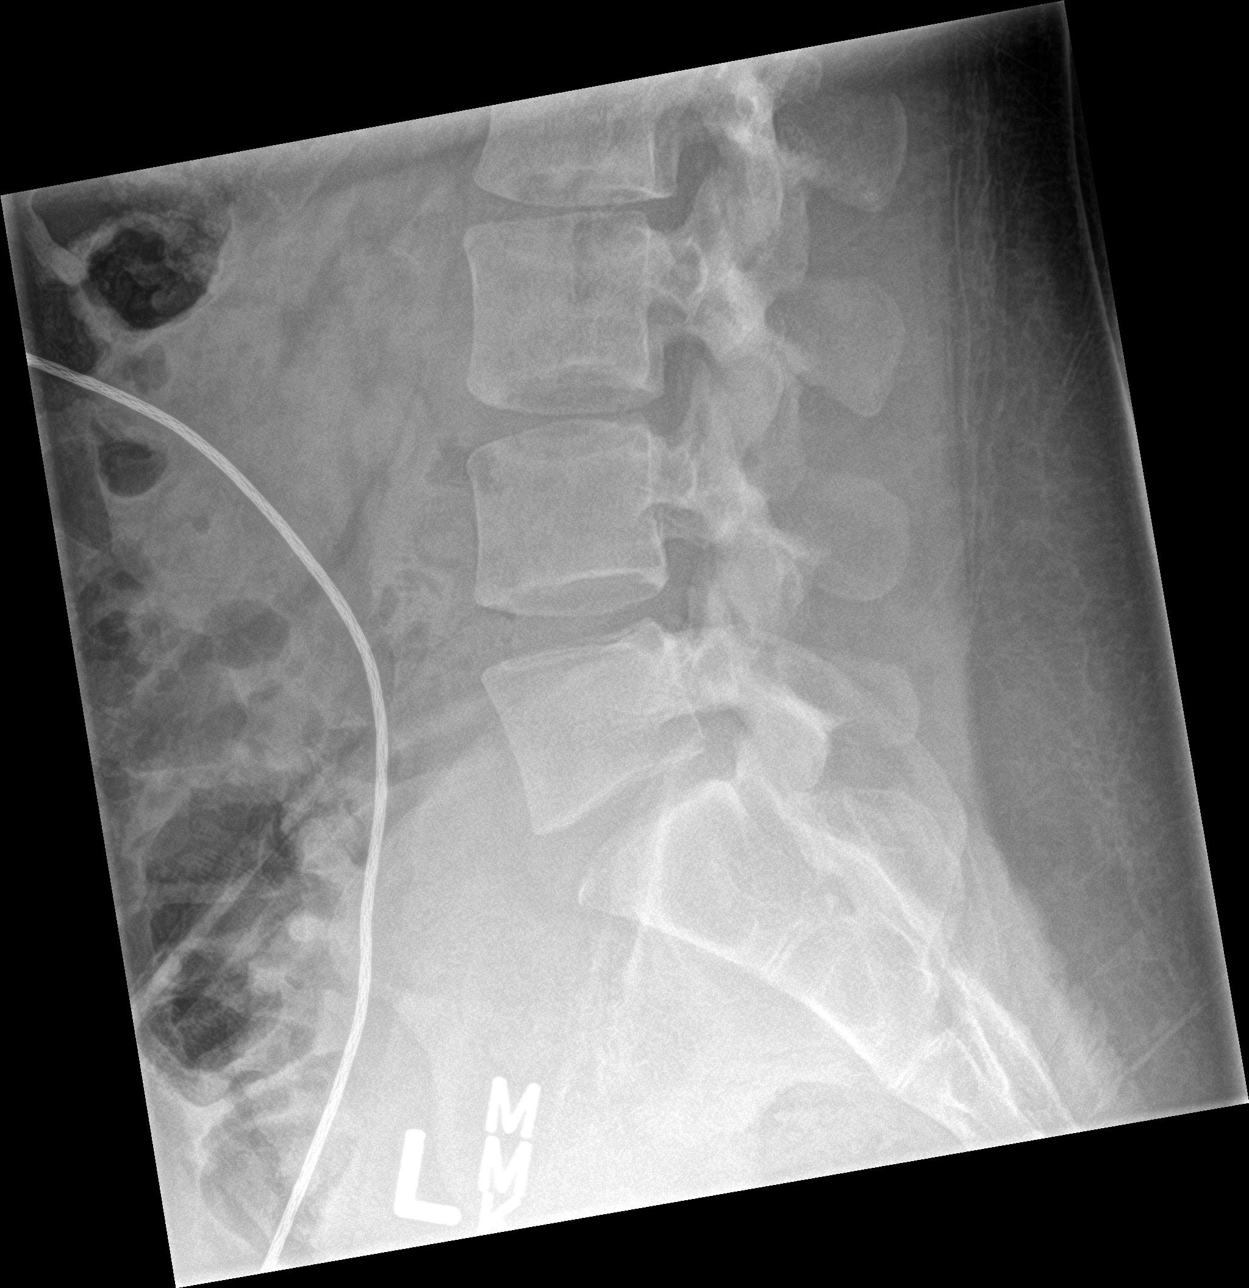

[5 of 5 positions shown; findings below may reference images not displayed]

FINDINGS: There is no evidence of lumbar spine fracture. Alignment is normal.
Intervertebral disc spaces are maintained.
IMPRESSION: Negative.

## 2019-01-05 ENCOUNTER — Other Ambulatory Visit: Payer: Self-pay

## 2019-01-05 ENCOUNTER — Emergency Department (HOSPITAL_COMMUNITY)
Admission: EM | Admit: 2019-01-05 | Discharge: 2019-01-05 | Disposition: A | Discharge status: Home / Self Care | Payer: Medicaid Other | Attending: Emergency Medicine | Admitting: Emergency Medicine

## 2019-01-05 ENCOUNTER — Emergency Department (HOSPITAL_COMMUNITY): Payer: Medicaid Other

## 2019-01-05 DIAGNOSIS — R519 Headache, unspecified: Secondary | ICD-10-CM

## 2019-01-05 DIAGNOSIS — M542 Cervicalgia: Secondary | ICD-10-CM | POA: Diagnosis not present

## 2019-01-05 DIAGNOSIS — R51 Headache: Secondary | ICD-10-CM | POA: Insufficient documentation

## 2019-01-05 LAB — CBC WITH DIFFERENTIAL/PLATELET
Abs Immature Granulocytes: 0.03 10*3/uL (ref 0.00–0.07)
Basophils Absolute: 0 10*3/uL (ref 0.0–0.1)
Basophils Relative: 1 %
Eosinophils Absolute: 0.3 10*3/uL (ref 0.0–0.5)
Eosinophils Relative: 3 %
HCT: 35.5 % — ABNORMAL LOW (ref 36.0–46.0)
Hemoglobin: 10.8 g/dL — ABNORMAL LOW (ref 12.0–15.0)
Immature Granulocytes: 0 %
Lymphocytes Relative: 24 %
Lymphs Abs: 1.9 10*3/uL (ref 0.7–4.0)
MCH: 26 pg (ref 26.0–34.0)
MCHC: 30.4 g/dL (ref 30.0–36.0)
MCV: 85.3 fL (ref 80.0–100.0)
Monocytes Absolute: 0.4 10*3/uL (ref 0.1–1.0)
Monocytes Relative: 6 %
Neutro Abs: 5 10*3/uL (ref 1.7–7.7)
Neutrophils Relative %: 66 %
Platelets: 398 10*3/uL (ref 150–400)
RBC: 4.16 MIL/uL (ref 3.87–5.11)
RDW: 16.8 % — ABNORMAL HIGH (ref 11.5–15.5)
WBC: 7.7 10*3/uL (ref 4.0–10.5)
nRBC: 0 % (ref 0.0–0.2)

## 2019-01-05 LAB — URINALYSIS, ROUTINE W REFLEX MICROSCOPIC: RBC / HPF: >50 RBC/hpf — ABNORMAL HIGH (ref 0–5)

## 2019-01-05 LAB — BASIC METABOLIC PANEL
Anion gap: 7 (ref 5–15)
BUN: 12 mg/dL (ref 6–20)
CO2: 23 mmol/L (ref 22–32)
Calcium: 9.1 mg/dL (ref 8.9–10.3)
Chloride: 109 mmol/L (ref 98–111)
Creatinine, Ser: 0.83 mg/dL (ref 0.44–1.00)
GFR calc Af Amer: >60 mL/min (ref >60)
GFR calc non Af Amer: >60 mL/min (ref >60)
Glucose, Bld: 93 mg/dL (ref 70–99)
Potassium: 3.9 mmol/L (ref 3.5–5.1)
Sodium: 139 mmol/L (ref 135–145)

## 2019-01-05 LAB — PREGNANCY, URINE: Preg Test, Ur: NEGATIVE

## 2019-01-05 MED ORDER — CYCLOBENZAPRINE HCL 10 MG PO TABS: 10.0000 mg | ORAL_TABLET | Freq: Three times a day (TID) | ORAL | 0 refills | Status: DC | PRN

## 2019-01-05 MED ORDER — DIPHENHYDRAMINE HCL 50 MG/ML IJ SOLN
25.0000 mg | Freq: Once | INTRAMUSCULAR | Status: AC
  Administered 2019-01-05: 14:00:00 25 mg via INTRAVENOUS
  Filled 2019-01-05: qty 1

## 2019-01-05 MED ORDER — PROCHLORPERAZINE EDISYLATE 10 MG/2ML IJ SOLN
10.0000 mg | Freq: Once | INTRAMUSCULAR | Status: AC
  Administered 2019-01-05: 14:00:00 10 mg via INTRAVENOUS
  Filled 2019-01-05: qty 2

## 2019-01-05 MED ORDER — SODIUM CHLORIDE 0.9 % IV BOLUS
1000.0000 mL | Freq: Once | INTRAVENOUS | Status: AC
  Administered 2019-01-05: 12:00:00 1000 mL via INTRAVENOUS

## 2019-01-05 MED ORDER — DEXAMETHASONE SODIUM PHOSPHATE 10 MG/ML IJ SOLN
10.0000 mg | Freq: Once | INTRAMUSCULAR | Status: AC
  Administered 2019-01-05: 15:00:00 10 mg via INTRAVENOUS
  Filled 2019-01-05: qty 1

## 2019-01-05 MED ORDER — TRAMADOL HCL 50 MG PO TABS: 50.0000 mg | ORAL_TABLET | Freq: Four times a day (QID) | ORAL | 0 refills | Status: DC | PRN

## 2019-01-05 MED ORDER — KETOROLAC TROMETHAMINE 30 MG/ML IJ SOLN
30.0000 mg | Freq: Once | INTRAMUSCULAR | Status: AC
  Administered 2019-01-05: 14:00:00 30 mg via INTRAVENOUS
  Filled 2019-01-05: qty 1

## 2019-01-05 MED ORDER — PREDNISONE 50 MG PO TABS: 50.0000 mg | ORAL_TABLET | Freq: Every day | ORAL | 0 refills | Status: DC

## 2019-01-05 MED ORDER — MORPHINE SULFATE (PF) 4 MG/ML IV SOLN
4.0000 mg | Freq: Once | INTRAVENOUS | Status: AC
  Administered 2019-01-05: 4 mg via INTRAVENOUS
  Filled 2019-01-05: qty 1

## 2019-01-05 NOTE — ED Provider Notes (Signed)
[facility] DEPT Provider Note   CSN: [id number] Arrival date & time: 01/05/19  1034     History   Chief Complaint Chief Complaint  Patient presents with   Headache    HPI Loretta Ellis is a 33 y.o. female.     HPI Patient presents to the emergency department with headache that started while she was at work.  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  Patient states that the headache is in the posterior aspect of her head.  Patient states she did not take any medications prior to arrival for her symptoms.  The patient denies chest pain, shortness of breath, ,blurred vision, fever, cough, weakness, numbness, dizziness, anorexia, edema, abdominal pain, nausea, vomiting, diarrhea, rash, back pain, dysuria, hematemesis, bloody stool, near syncope, or syncope. Past Medical History:  Diagnosis Date   Anemia    Chlamydia    Gonorrhea    Heart murmur    No pertinent past medical history    Pregnancy induced hypertension     Patient Active Problem List   Diagnosis Date Noted   Normal labor 02/10/2016    Past Surgical History:  Procedure Laterality Date   ADENOIDECTOMY     EAR TUBE REMOVAL     TONSILLECTOMY       OB History    Gravida  4   Para  2   Term  2   Preterm  0   AB  2   Living  2     SAB  0   TAB  0   Ectopic  0   Multiple      Live Births  2            Home Medications    Prior to Admission medications   Medication Sig Start Date End Date Taking? Authorizing Provider  acetaminophen (TYLENOL) 500 MG tablet Take 1,000 mg by mouth every 6 (six) hours as needed for mild pain.   Yes [provider]  ibuprofen (ADVIL,MOTRIN) 600 MG tablet Take 1 tablet (600 mg total) by mouth every 6 (six) hours as needed. Patient not taking: Reported on 01/05/2019 11/23/16   Seabron Spates, CNM    Family History Family History  Problem Relation Age of  Onset   Diabetes Mother    Hyperlipidemia Mother    Hypertension Mother    Stroke Mother    Diabetes Father    Hyperlipidemia Father    Hypertension Father    Heart disease Maternal Grandfather    Anesthesia problems Neg Hx    Hypotension Neg Hx    Malignant hyperthermia Neg Hx    Pseudochol deficiency Neg Hx     Social History Social History   Tobacco Use   Smoking status: Never Smoker   Smokeless tobacco: Never Used  Substance Use Topics   Alcohol use: No   Drug use: No     Allergies   Patient has no known allergies.   Review of Systems Review of Systems All other systems negative except as documented in the HPI. All pertinent positives and negatives as reviewed in the HPI.  Physical Exam Updated Vital Signs BP (!) 148/93 (BP Location: Right Arm)    Pulse 63    Resp 11    Ht 5\' 9"  (1.753 m)    Wt 86.2 kg    LMP 01/05/2019    SpO2 100%    Breastfeeding No  BMI 28.06 kg/m   Physical Exam Vitals signs and nursing note reviewed.  Constitutional:      General: She is not in acute distress.    Appearance: She is well-developed.  HENT:     Head: Normocephalic and atraumatic.  Eyes:     Pupils: Pupils are equal, round, and reactive to light.  Neck:     Musculoskeletal: Normal range of motion and neck supple.  Cardiovascular:     Rate and Rhythm: Normal rate and regular rhythm.     Heart sounds: Normal heart sounds. No murmur. No friction rub. No gallop.   Pulmonary:     Effort: Pulmonary effort is normal. No respiratory distress.     Breath sounds: Normal breath sounds. No wheezing.  Abdominal:     General: Bowel sounds are normal. There is no distension.     Palpations: Abdomen is soft.     Tenderness: There is no abdominal tenderness.  Skin:    General: Skin is warm and dry.     Capillary Refill: Capillary refill takes less than 2 seconds.     Findings: No erythema or rash.  Neurological:     Mental Status: She is alert and oriented to  person, place, and time.     GCS: GCS eye subscore is 4. GCS verbal subscore is 5. GCS motor subscore is 6.     Sensory: No sensory deficit.     Motor: No weakness or abnormal muscle tone.     Coordination: Coordination normal.  Psychiatric:        Behavior: Behavior normal.      ED Treatments / Results  Labs (all labs ordered are listed, but only abnormal results are displayed) Labs Reviewed  URINALYSIS, ROUTINE W REFLEX MICROSCOPIC - Abnormal; Notable for the following components:      Result Value   Color, Urine RED (*)    APPearance TURBID (*)    Glucose, UA   (*)    Value: TEST NOT REPORTED DUE TO COLOR INTERFERENCE OF URINE PIGMENT   Hgb urine dipstick   (*)    Value: TEST NOT REPORTED DUE TO COLOR INTERFERENCE OF URINE PIGMENT   Bilirubin Urine   (*)    Value: TEST NOT REPORTED DUE TO COLOR INTERFERENCE OF URINE PIGMENT   Ketones, ur   (*)    Value: TEST NOT REPORTED DUE TO COLOR INTERFERENCE OF URINE PIGMENT   Protein, ur   (*)    Value: TEST NOT REPORTED DUE TO COLOR INTERFERENCE OF URINE PIGMENT   Nitrite   (*)    Value: TEST NOT REPORTED DUE TO COLOR INTERFERENCE OF URINE PIGMENT   Leukocytes,Ua   (*)    Value: TEST NOT REPORTED DUE TO COLOR INTERFERENCE OF URINE PIGMENT   RBC / HPF >50 (*)    Bacteria, UA FEW (*)    Crystals PRESENT (*)    All other components within normal limits  CBC WITH DIFFERENTIAL/PLATELET - Abnormal; Notable for the following components:   Hemoglobin 10.8 (*)    HCT 35.5 (*)    RDW 16.8 (*)    All other components within normal limits  PREGNANCY, URINE  BASIC METABOLIC PANEL    EKG None  Radiology Ct Head Wo Contrast  Result Date: 01/05/2019 CLINICAL DATA:  Headache. EXAM: CT HEAD WITHOUT CONTRAST TECHNIQUE: Contiguous axial images were obtained from the base of the skull through the vertex without intravenous contrast. COMPARISON:  CT scan of May 29, 2016. FINDINGS: Brain:  No evidence of acute infarction, hemorrhage,  hydrocephalus, extra-axial collection or mass lesion/mass effect. Vascular: No hyperdense vessel or unexpected calcification. Skull: Normal. Negative for fracture or focal lesion. Sinuses/Orbits: No acute finding. Other: None. IMPRESSION: Normal head CT. Electronically Signed   By: Lupita RaiderJames  Green Jr M.D.   On: 01/05/2019 13:25    Procedures Procedures (including critical care time)  Medications Ordered in ED Medications  sodium chloride 0.9 % bolus 1,000 mL (0 mLs Intravenous Stopped 01/05/19 1351)  ketorolac (TORADOL) 30 MG/ML injection 30 mg (30 mg Intravenous Given 01/05/19 1403)  prochlorperazine (COMPAZINE) injection 10 mg (10 mg Intravenous Given 01/05/19 1403)  diphenhydrAMINE (BENADRYL) injection 25 mg (25 mg Intravenous Given 01/05/19 1402)  morphine 4 MG/ML injection 4 mg (4 mg Intravenous Given 01/05/19 1438)  dexamethasone (DECADRON) injection 10 mg (10 mg Intravenous Given 01/05/19 1437)     Initial Impression / Assessment and Plan / ED Course  I have reviewed the triage vital signs and the nursing notes.  Pertinent labs & imaging results that were available during my care of the patient were reviewed by me and considered in my medical decision making (see chart for details).    I feel the patient has neck and tension type headache.  Patient is having no pain at this time.  Told to return here as needed.  Patient agrees the plan and all questions were answered.  She has no neurological deficits noted on examination.    Final Clinical Impressions(s) / ED Diagnoses   Final diagnoses:  None    ED Discharge Orders    None       Charlestine NightLawyer, Makenli Derstine, PA-C 01/05/19 1523    Sabas SousBero, Michael M, MD 01/11/19 [phone]

## 2019-01-05 NOTE — Discharge Instructions (Signed)
Return here as needed.  Follow-up with your doctor for a recheck.  Your testing here today did not show any significant abnormalities.

## 2019-01-05 NOTE — ED Triage Notes (Signed)
Per EMS at 0700 patient started having headache and general weakness. No h/o migraines. H/o anxiety and reports anxiety this morning. Per EMS patient also reports heaving clotting at present with menstruation. No n/v/d. Headache localized to occipital region.

## 2019-12-03 ENCOUNTER — Ambulatory Visit: Payer: Medicaid Other | Attending: Internal Medicine

## 2019-12-03 DIAGNOSIS — Z23 Encounter for immunization: Secondary | ICD-10-CM

## 2019-12-03 NOTE — Progress Notes (Signed)
   Covid-19 Vaccination Clinic  Name:  Loretta Ellis    MRN: [id number] DOB: 1985/06/29  12/03/2019  [name] was observed post Covid-19 immunization for 15 minutes without incident. She was provided with Vaccine Information Sheet and instruction to access the V-Safe system.   [name] was instructed to call 911 with any severe reactions post vaccine: Marland Kitchen Difficulty breathing  . Swelling of face and throat  . A fast heartbeat  . A bad rash all over body  . Dizziness and weakness   Immunizations Administered    Name Date Dose VIS Date Route   Pfizer COVID-19 Vaccine 12/03/2019  4:50 PM 0.3 mL 07/04/2018 Intramuscular   Manufacturer: [employer], [employer]   Lot: [id number]   NDC: [address]-3

## 2020-01-01 ENCOUNTER — Ambulatory Visit: Payer: Medicaid Other

## 2020-02-13 ENCOUNTER — Other Ambulatory Visit: Payer: Self-pay

## 2020-02-13 ENCOUNTER — Emergency Department (HOSPITAL_COMMUNITY)
Admission: EM | Admit: 2020-02-13 | Discharge: 2020-02-14 | Disposition: A | Discharge status: Home / Self Care | Payer: Medicaid Other | Attending: Emergency Medicine | Admitting: Emergency Medicine

## 2020-02-13 ENCOUNTER — Emergency Department (HOSPITAL_COMMUNITY): Payer: Medicaid Other

## 2020-02-13 ENCOUNTER — Encounter (HOSPITAL_COMMUNITY): Payer: Self-pay

## 2020-02-13 DIAGNOSIS — Z5321 Procedure and treatment not carried out due to patient leaving prior to being seen by health care provider: Secondary | ICD-10-CM | POA: Insufficient documentation

## 2020-02-13 DIAGNOSIS — R519 Headache, unspecified: Secondary | ICD-10-CM | POA: Insufficient documentation

## 2020-02-13 DIAGNOSIS — R079 Chest pain, unspecified: Secondary | ICD-10-CM | POA: Diagnosis present

## 2020-02-13 LAB — CBC
HCT: 35.2 % — ABNORMAL LOW (ref 36.0–46.0)
Hemoglobin: 10.9 g/dL — ABNORMAL LOW (ref 12.0–15.0)
MCH: 26.3 pg (ref 26.0–34.0)
MCHC: 31 g/dL (ref 30.0–36.0)
MCV: 84.8 fL (ref 80.0–100.0)
Platelets: 334 10*3/uL (ref 150–400)
RBC: 4.15 MIL/uL (ref 3.87–5.11)
RDW: 15.1 % (ref 11.5–15.5)
WBC: 7.8 10*3/uL (ref 4.0–10.5)
nRBC: 0 % (ref 0.0–0.2)

## 2020-02-13 LAB — TROPONIN I (HIGH SENSITIVITY)
Troponin I (High Sensitivity): 3 ng/L (ref <18)
Troponin I (High Sensitivity): 3 ng/L (ref <18)

## 2020-02-13 LAB — BASIC METABOLIC PANEL
Anion gap: 7 (ref 5–15)
BUN: 9 mg/dL (ref 6–20)
CO2: 24 mmol/L (ref 22–32)
Calcium: 9.2 mg/dL (ref 8.9–10.3)
Chloride: 106 mmol/L (ref 98–111)
Creatinine, Ser: 0.86 mg/dL (ref 0.44–1.00)
GFR calc non Af Amer: >60 mL/min (ref >60)
Glucose, Bld: 94 mg/dL (ref 70–99)
Potassium: 3.9 mmol/L (ref 3.5–5.1)
Sodium: 137 mmol/L (ref 135–145)

## 2020-02-13 LAB — I-STAT BETA HCG BLOOD, ED (MC, WL, AP ONLY): I-stat hCG, quantitative: <5 m[IU]/mL (ref <5)

## 2020-02-13 NOTE — ED Triage Notes (Signed)
Pt reports chest pain and headache that started yesterday, no other symptoms. Pt a.o, family hx of cardiac problems.

## 2020-02-14 NOTE — ED Notes (Signed)
Called pt several time for vital recheck and no one answered. Pt placed OTF

## 2021-06-07 ENCOUNTER — Emergency Department (HOSPITAL_BASED_OUTPATIENT_CLINIC_OR_DEPARTMENT_OTHER): Payer: Medicaid Other

## 2021-06-07 ENCOUNTER — Other Ambulatory Visit: Payer: Self-pay

## 2021-06-07 ENCOUNTER — Emergency Department (HOSPITAL_BASED_OUTPATIENT_CLINIC_OR_DEPARTMENT_OTHER)
Admission: EM | Admit: 2021-06-07 | Discharge: 2021-06-07 | Disposition: A | Discharge status: Home / Self Care | Payer: Medicaid Other | Attending: Emergency Medicine | Admitting: Emergency Medicine

## 2021-06-07 DIAGNOSIS — M25561 Pain in right knee: Secondary | ICD-10-CM

## 2021-06-07 NOTE — ED Provider Notes (Signed)
[facility] EMERGENCY DEPT Provider Note   CSN: [id number] Arrival date & time: 06/07/21  2124     History  Chief Complaint  Patient presents with   Knee Pain    Right    Loretta Ellis is a 36 y.o. female.  Patient with an injury to her right knee and anterior part of her upper shin 3 weeks ago during flag football.  Patient thought it would get better.  But it has not.  As she has some increased discomfort in the area.  Denies any knee swelling.  The knee giving out.  Past medical history is significant for STDs.  Pregnancy-induced hypertension.      Home Medications Prior to Admission medications   Medication Sig Start Date End Date Taking? Authorizing Provider  acetaminophen (TYLENOL) 500 MG tablet Take 1,000 mg by mouth every 6 (six) hours as needed for mild pain.    [provider]  cyclobenzaprine (FLEXERIL) 10 MG tablet Take 1 tablet (10 mg total) by mouth 3 (three) times daily as needed for muscle spasms. 01/05/19   Lawyer, Harrell Gave, PA-C  ibuprofen (ADVIL,MOTRIN) 600 MG tablet Take 1 tablet (600 mg total) by mouth every 6 (six) hours as needed. Patient not taking: Reported on 01/05/2019 11/23/16   Seabron Spates, CNM  predniSONE (DELTASONE) 50 MG tablet Take 1 tablet (50 mg total) by mouth daily with breakfast. 01/05/19   Lawyer, Harrell Gave, PA-C  traMADol (ULTRAM) 50 MG tablet Take 1 tablet (50 mg total) by mouth every 6 (six) hours as needed for severe pain. 01/05/19   Dalia Heading, PA-C      Allergies    Patient has no known allergies.    Review of Systems   Review of Systems  Constitutional:  Negative for chills and fever.  HENT:  Negative for ear pain and sore throat.   Eyes:  Negative for pain and visual disturbance.  Respiratory:  Negative for cough and shortness of breath.   Cardiovascular:  Negative for chest pain and palpitations.  Gastrointestinal:  Negative for abdominal pain and vomiting.  Genitourinary:  Negative  for dysuria and hematuria.  Musculoskeletal:  Negative for arthralgias, back pain and joint swelling.  Skin:  Negative for color change and rash.  Neurological:  Negative for seizures and syncope.  All other systems reviewed and are negative.  Physical Exam Updated Vital Signs BP (!) 151/90 (BP Location: Right Arm)    Pulse 73    Temp 99.1 F (37.3 C) (Oral)    Resp 20    Ht 1.753 m (5\' 9" )    Wt 93 kg    SpO2 100%    BMI 30.27 kg/m  Physical Exam Vitals and nursing note reviewed.  Constitutional:      General: She is not in acute distress.    Appearance: Normal appearance. She is well-developed.  HENT:     Head: Normocephalic and atraumatic.  Eyes:     Extraocular Movements: Extraocular movements intact.     Conjunctiva/sclera: Conjunctivae normal.     Pupils: Pupils are equal, round, and reactive to light.  Cardiovascular:     Rate and Rhythm: Normal rate and regular rhythm.     Heart sounds: No murmur heard. Pulmonary:     Effort: Pulmonary effort is normal. No respiratory distress.     Breath sounds: Normal breath sounds.  Abdominal:     Palpations: Abdomen is soft.     Tenderness: There is no abdominal tenderness.  Musculoskeletal:  General: Tenderness present. No swelling or deformity.     Cervical back: Normal range of motion and neck supple.     Comments: Right knee with some joint line tenderness.  No effusion.  Proximal anterior shin area with a linear bruise and some thickening which seems to be secondary to an injury to that area.  No evidence of infection.  Distally dorsalis pedis pulses 2+.  Good movement at the knee good movement of the ankle good movement of the toes.  Skin:    General: Skin is warm and dry.     Capillary Refill: Capillary refill takes less than 2 seconds.  Neurological:     General: No focal deficit present.     Mental Status: She is alert and oriented to person, place, and time.     Cranial Nerves: No cranial nerve deficit.      Sensory: No sensory deficit.     Motor: No weakness.  Psychiatric:        Mood and Affect: Mood normal.    ED Results / Procedures / Treatments   Labs (all labs ordered are listed, but only abnormal results are displayed) Labs Reviewed - No data to display  EKG None  Radiology DG Knee Complete 4 Views Right  Result Date: 06/07/2021 CLINICAL DATA:  Knee pain following football injury 3 weeks ago with persistent pain, initial encounter EXAM: RIGHT KNEE - COMPLETE 4+ VIEW COMPARISON:  01/11/2010 FINDINGS: No evidence of fracture, dislocation, or joint effusion. No evidence of arthropathy or other focal bone abnormality. Soft tissues are unremarkable. IMPRESSION: No acute abnormality noted. Electronically Signed   By: Inez Catalina M.D.   On: 06/07/2021 22:31    Procedures Procedures    Medications Ordered in ED Medications - No data to display  ED Course/ Medical Decision Making/ A&P                           Medical Decision Making Amount and/or Complexity of Data Reviewed Radiology: ordered.   X-ray of the right knee without effusion or any bony abnormalities.  Most of the pain is in the joint line area.  And there is also a linear area that probably was a contusion that has some thickening probably from some induration or some scar on healing.  No open wound.  Neurovascularly intact distally.  We will treat with knee immobilizer and have her follow-up with sports medicine.   Final Clinical Impression(s) / ED Diagnoses Final diagnoses:  Acute pain of right knee    Rx / DC Orders ED Discharge Orders     None         Fredia Sorrow, MD 06/07/21 2253

## 2021-06-07 NOTE — Discharge Instructions (Signed)
I would recommend Motrin 800 mg every 8 hours as needed for pain.  Wear the knee immobilizer for comfort.  Make an appointment to follow-up with sports medicine.  X-rays without any acute bony abnormalities.  But there can be internal injuries to the knee that do not show up on x-ray.

## 2021-06-07 NOTE — ED Triage Notes (Signed)
Pt was playing flag football 3 weeks ago and was hit in right knee. Pain has not improved and it having tingling down right leg. Pt ambulated to room.

## 2021-06-08 ENCOUNTER — Ambulatory Visit: Payer: Medicaid Other | Admitting: Family Medicine

## 2021-06-08 ENCOUNTER — Encounter: Payer: Self-pay | Admitting: Family Medicine

## 2021-06-08 ENCOUNTER — Ambulatory Visit: Payer: Self-pay

## 2021-06-08 VITALS — BP 134/86 | Ht 69.0 in | Wt 205.0 lb

## 2021-06-08 DIAGNOSIS — S83521A Sprain of posterior cruciate ligament of right knee, initial encounter: Secondary | ICD-10-CM | POA: Diagnosis present

## 2021-06-08 DIAGNOSIS — M25561 Pain in right knee: Secondary | ICD-10-CM

## 2021-06-08 MED ORDER — HYDROCODONE-ACETAMINOPHEN 5-325 MG PO TABS: 1.0000 | ORAL_TABLET | Freq: Three times a day (TID) | ORAL | 0 refills | Status: DC | PRN

## 2021-06-08 NOTE — Progress Notes (Signed)
°  Loretta Ellis - 36 y.o. female MRN [id number]  Date of birth: 20-Mar-1986  SUBJECTIVE:  Including CC & ROS.  No chief complaint on file.   Loretta Ellis is a 36 y.o. female that is  presenting with acute right knee pain. She was hit on the anterior portion of her right knee by an opposing player in flag football. Occurred three weeks ago. Having instability and recurrent effusion. Works as a Emergency planning/management officer.  Review of the note from 1/29 shows she was treated with a knee immobilizer. Independent review of the right knee x-ray from 1/29 shows no acute changes.   Review of Systems See HPI   HISTORY: Past Medical, Surgical, Social, and Family History Reviewed & Updated per EMR.   Pertinent Historical Findings include:  Past Medical History:  Diagnosis Date   Anemia    Chlamydia    Gonorrhea    Heart murmur    No pertinent past medical history    Pregnancy induced hypertension     Past Surgical History:  Procedure Laterality Date   ADENOIDECTOMY     EAR TUBE REMOVAL     TONSILLECTOMY       PHYSICAL EXAM:  VS: BP 134/86 (BP Location: Left Arm, Patient Position: Sitting)    Ht 5\' 9"  (1.753 m)    Wt 205 lb (93 kg)    BMI 30.27 kg/m  Physical Exam Gen: NAD, alert, cooperative with exam, well-appearing MSK:  Neurovascularly intact    Limited ultrasound: Right knee:  No effusion. Normal-appearing quadricep patellar tendon. No change of the medial or lateral meniscus.  Summary: No structural changes appreciated  Ultrasound and interpretation by Clearance Coots, MD    ASSESSMENT & PLAN:   Sprain of posterior cruciate ligament of right knee Acutely occurring.  Having instability with a posterior drawer test on exam today. Had a forceful blow to the anterior aspect of the knee. -Counseled on home exercise therapy and supportive care. -Hinged knee brace and provided crutches. -Norco. -Could consider further imaging.

## 2021-06-08 NOTE — Assessment & Plan Note (Signed)
Acutely occurring.  Having instability with a posterior drawer test on exam today. Had a forceful blow to the anterior aspect of the knee. -Counseled on home exercise therapy and supportive care. -Hinged knee brace and provided crutches. -Norco. -Could consider further imaging.

## 2021-06-08 NOTE — Patient Instructions (Signed)
Nice to meet you Please try ice  Please use the pain medicine as needed  Please send me a message in MyChart with any questions or updates.  Please see me back in 2-3 weeks.   --Dr. Jordan Likes

## 2021-06-24 ENCOUNTER — Ambulatory Visit: Payer: Medicaid Other | Admitting: Family Medicine

## 2021-07-16 ENCOUNTER — Ambulatory Visit: Payer: Medicaid Other | Attending: Orthopaedic Surgery | Admitting: Physical Therapy

## 2021-07-16 ENCOUNTER — Other Ambulatory Visit: Payer: Self-pay

## 2021-07-16 DIAGNOSIS — M25561 Pain in right knee: Secondary | ICD-10-CM | POA: Diagnosis present

## 2021-07-16 DIAGNOSIS — R262 Difficulty in walking, not elsewhere classified: Secondary | ICD-10-CM | POA: Diagnosis present

## 2021-07-16 DIAGNOSIS — R600 Localized edema: Secondary | ICD-10-CM | POA: Diagnosis present

## 2021-07-16 DIAGNOSIS — M25661 Stiffness of right knee, not elsewhere classified: Secondary | ICD-10-CM | POA: Insufficient documentation

## 2021-07-16 NOTE — Therapy (Addendum)
OUTPATIENT PHYSICAL THERAPY LOWER EXTREMITY EVALUATION   Patient Name: Loretta Ellis MRN: [id number] DOB:Nov 26, 1985, 36 y.o., female Today's Date: 07/16/2021   PT End of Session - 07/16/21 1156     Visit Number 1    Number of Visits 16    Date for PT Re-Evaluation 09/10/21    Authorization Type [employer]    PT Start Time 1112    PT Stop Time 1152    PT Time Calculation (min) 40 min    Activity Tolerance Patient tolerated treatment well    Behavior During Therapy WFL for tasks assessed/performed             Past Medical History:  Diagnosis Date   Anemia    Chlamydia    Gonorrhea    Heart murmur    No pertinent past medical history    Pregnancy induced hypertension    Past Surgical History:  Procedure Laterality Date   ADENOIDECTOMY     EAR TUBE REMOVAL     TONSILLECTOMY     Patient Active Problem List   Diagnosis Date Noted   Sprain of posterior cruciate ligament of right knee 06/08/2021   Normal labor 02/10/2016    [facility]P: [facility]  REFERRING PROVIDER: Bjorn Pippin, MD  REFERRING DIAG: [phone] (ICD-10-CM) - Lateral knee pain, right   THERAPY DIAG:  Acute pain of right knee  Stiffness of right knee, not elsewhere classified  Localized edema  Difficulty in walking, not elsewhere classified  ONSET DATE: 05/2021  SUBJECTIVE:   SUBJECTIVE STATEMENT: Patient plays flag football and was hit directly from the front in late January.  She fell on the knee, noticed swelling and severe pain.  She was able to bear weight at that time and did followed up with Dr. Jordan Likes and then Dr. Everardo Pacific.  [place] and MRI were done and was negative for tear.  She wears brace when standing, working.   She is not longer able to playfootball, she is on a competive league and travels often for games.  Her knee cont to swell depending on level of activity, takes min to no medicine for pain . Her Rt hip is sore as well, has not been doing any exercise other than long  arc quad recommended by a client of hers. She is anxious to get back into the gym.   PERTINENT HISTORY: Otherwise healthy   PAIN:  Are you having pain? Yes NPRS scale: 6/10 can be 8/10 with working  Pain location: Rt anterior knee  Pain orientation: Right  PAIN TYPE: aching, shooting at times Pain description: constant and aching  Aggravating factors: standing, bending it, turning over in bed  Relieving factors: brace, meds, ice , rest , baths  PRECAUTIONS: None  WEIGHT BEARING RESTRICTIONS No  FALLS:  Has patient fallen in last 6 months? No, Number of falls: 0, knee gave out the other day but not a balance  LIVING ENVIRONMENT: Lives with: lives with their family Lives in: House/apartment Stairs: Yes; External: 12 steps; on right going up Has following equipment at home: Crutches and knee brace   OCCUPATION: hairdresser, competitive Nutritional therapist   PLOF: Independent  PATIENT GOALS Pt would like to get back to the gym, play football again    OBJECTIVE:   DIAGNOSTIC FINDINGS: No effusion. Normal-appearing quadricep patellar tendon. No change of the medial or lateral meniscus.   Summary: No structural changes appreciated  PATIENT SURVEYS:  LEFS 57/80   (GOAL 48/80)  COGNITION:  Overall cognitive status: Within functional limits for tasks assessed     SENSATION:  Light touch: Deficits knee tingles at times     PALPATION: General pain and soreness surrounding patella and medial /lateral joint line ,  none in post knee  LE AROM/PROM:  A/PROM Right 07/16/2021 Left 07/16/2021  Hip flexion 90 deg pain in post L hip  WFL  Hip extension    Hip abduction    Hip adduction    Hip internal rotation Pain  WFL  Hip external rotation Pain  WFL  Knee flexion 84 130  Knee extension 5 0  Ankle dorsiflexion    Ankle plantarflexion    Ankle inversion    Ankle eversion     (Blank rows = not tested)  LE MMT:  MMT Right 07/16/2021 Left 07/16/2021  Hip flexion 5/5    Hip extension    Hip abduction    Hip adduction    Hip internal rotation    Hip external rotation    Knee flexion 4+/5   Knee extension 3+/5 pain and fearful    Ankle dorsiflexion 5/5   Ankle plantarflexion    Ankle inversion    Ankle eversion     (Blank rows = not tested)   FUNCTIONAL TESTS:  5 times sit to stand: 22 sec , decr WB on RLE  SLS Rt LE 5 sec with pain , LLE WFL   GAIT: Distance walked: 150 Assistive device utilized: None Level of assistance: Modified independence Comments: limp, antalgic gait with brace     TODAY'S TREATMENT: PT eval, HEP and intro to PT, Told her she could go the gym and ride the bike, do HEP and maybe work upper body, avoid closed chain until she can be supervised here.  Also wants to get a massage and I said that was ok as long as she felt comfortable communicating with her massage therapist about her knee.    PATIENT EDUCATION:  Education details: See above ,also PCL function and location  Person educated: Patient Education method: Explanation and Handouts Education comprehension: verbalized understanding, returned demonstration, and needs further education   HOME EXERCISE PROGRAM: Access Code: [email or web address] URL: [email or web address] Date: 07/16/2021 Prepared by: Karie Mainland  Exercises Supine Quad Set - 3-5 x daily - 7 x weekly - 2 sets - 10 reps - 30 hold Seated Long Arc Quad - 3 x daily - 7 x weekly - 2 sets - 10 reps - 5 hold Supine Heel Slide with Strap - 2 x daily - 7 x weekly - 1 sets - 10 reps - 10-15 hold Supine Single Knee to Chest Stretch - 2 x daily - 7 x weekly - 2 sets - 10 reps - 5 hold Supine Active Straight Leg Raise - 2 x daily - 7 x weekly - 2 sets - 10 reps - 5 hold   ASSESSMENT:  CLINICAL IMPRESSION: Patient is a 36  y.o. female who was seen today for physical therapy evaluation and treatment for Rt knee injury in which she strained her PCL. She has done very little with her knee since the  injury.  She was able to increase ROM with AAROM to 100 deg.  Pain with knee flexion and SLR. She has some swelling in Rt knee which fluctuates depending on activity. She is eager to get back to competitive flag football and work without pain in knee.    OBJECTIVE IMPAIRMENTS decreased activity tolerance, decreased endurance, decreased knowledge of use of DME, decreased mobility,  difficulty walking, decreased ROM, decreased strength, hypomobility, increased edema, increased fascial restrictions, impaired flexibility, postural dysfunction, and pain.   ACTIVITY LIMITATIONS cleaning, community activity, meal prep, occupation, and shopping.   PERSONAL FACTORS Profession are also affecting patient's functional outcome.    REHAB POTENTIAL: Excellent  CLINICAL DECISION MAKING: Stable/uncomplicated  EVALUATION COMPLEXITY: Low   GOALS: Goals reviewed with patient? Yes  SHORT TERM GOALS:  Pt will be able to show I with HEP for Rt knee and hips Baseline: given on eval  Target date: 08/13/2021 Goal status: INITIAL  2.  Pt will be able to sit with Rt knee to 90 deg for normal sit to stand mechanics and transfers Baseline: 84 deg sitting Target date: 08/13/2021 Goal status: INITIAL  3.  Pt will be able to stand and work a full day with pain controlled < 5/10 with brace if needed  Baseline: 8/10 after work with brace  Target date: 08/13/2021 Goal status: INITIAL  4.  Pt will be able to walk in her home and the clinic without brace and no feelings of instability  Baseline: has had her knee buckle at times since injury Target date: 08/13/2021 Goal status: INITIAL  LONG TERM GOALS:  Pt will be able to improve LEFS score to 48/80 to demo improved LE function.  Baseline: 57 % Target date: 08/13/2021 Goal status: INITIAL  2.  Pt will be able to improve sit to stand time to < 12 sec  Baseline: 22 sec  Target date: 09/10/2021 Goal status: INITIAL  3.  Pt will demo 5/5 strength in Rt knee and hip  for maximal stability with walking, exercise  Baseline: quads , painful 4-/5, weak , pain in Rt hip  Target date: 09/10/2021 Goal status: INITIAL  4.  Pt will no longer need the brace for work, walking in the community  Baseline: uses when standing most of the time even in home  Target date: 09/10/2021 Goal status: INITIAL  5.  Pt will be Independent with HEP for knee, hip and return to gym workouts without increased pain  Baseline: unknown Target date: 09/10/2021 Goal status: INITIAL  6.  Pt will be able to go up and down steps with 1 rail, reciprocally with good knee control  Baseline: one at a time , 1 rail  Target date: 09/10/2021 Goal status: INITIAL PLAN: PT FREQUENCY: 2x/week  PT DURATION: 8 weeks  PLANNED INTERVENTIONS: Therapeutic exercises, Therapeutic activity, Neuromuscular re-education, Balance training, Gait training, Patient/Family education, Joint mobilization, Stair training, Aquatic Therapy, Dry Needling, Cryotherapy, Moist heat, Taping, and Manual therapy  PLAN FOR NEXT SESSION: HEP, bike, add hamstring strength ,progress to standing as tolerated, ice post?   Jaquaya Coyle, PT 07/16/2021, 11:58 AM   Check all possible CPT codes: [id number]- Therapeutic Exercise, [phone]- Neuro Re-education, [phone] - Gait Training, 97140 - Manual Therapy, 97530 - Therapeutic Activities, 97535 - Self Care, 97014 - Electrical stimulation (unattended), 97035 - Ultrasound, 97016 - Vaso, and [id number] - Physical performance training     If treatment provided at initial evaluation, no treatment charged due to lack of authorization.

## 2021-07-27 ENCOUNTER — Other Ambulatory Visit: Payer: Self-pay

## 2021-07-27 ENCOUNTER — Ambulatory Visit: Payer: Medicaid Other | Admitting: Physical Therapy

## 2021-07-27 DIAGNOSIS — M25661 Stiffness of right knee, not elsewhere classified: Secondary | ICD-10-CM

## 2021-07-27 DIAGNOSIS — M25561 Pain in right knee: Secondary | ICD-10-CM

## 2021-07-27 DIAGNOSIS — R262 Difficulty in walking, not elsewhere classified: Secondary | ICD-10-CM

## 2021-07-27 DIAGNOSIS — R600 Localized edema: Secondary | ICD-10-CM

## 2021-07-27 NOTE — Therapy (Signed)
?OUTPATIENT PHYSICAL THERAPY TREATMENT NOTE ? ? ?Patient Name: Loretta Ellis ?MRN: [id number] ?DOB:1985/07/31, 36 y.o., female ?Today's Date: 07/27/2021 ? ?[facility]P: [facility] ? ? PT End of Session - 07/27/21 0802   ? ? Visit Number 2   ? Number of Visits 16   ? Date for PT Re-Evaluation 09/10/21   ? Authorization Type [employer]   ? PT Start Time 0800   ? ?  ?  ? ?  ? ? ?Past Medical History:  ?Diagnosis Date  ? Anemia   ? Chlamydia   ? Gonorrhea   ? Heart murmur   ? No pertinent past medical history   ? Pregnancy induced hypertension   ? ?Past Surgical History:  ?Procedure Laterality Date  ? ADENOIDECTOMY    ? EAR TUBE REMOVAL    ? TONSILLECTOMY    ? ?Patient Active Problem List  ? Diagnosis Date Noted  ? Sprain of posterior cruciate ligament of right knee 06/08/2021  ? Normal labor 02/10/2016  ? ? ?REFERRING DIAG: M25.561 (ICD-10-CM) - Lateral knee pain, right  ? ?THERAPY DIAG:  ?Acute pain of right knee ? ?Stiffness of right knee, not elsewhere classified ? ?Localized edema ? ?Difficulty in walking, not elsewhere classified ? ?PERTINENT HISTORY: Patient with an injury to her right knee and anterior part of her upper shin 3 weeks ago during flag football.  Patient thought it would get better.  But it has not.  As she has some increased discomfort in the area.  Denies any knee swelling.  The knee giving out.  ? ?PRECAUTIONS: none ? ?SUBJECTIVE: It feels funny when I walk.  I have been trying to walk without the brace. No pain really.  ? ?PAIN:  ?Are you having pain? No ? ? ? ? ? ?(DIAGNOSTIC FINDINGS: No effusion. ?Normal-appearing quadricep patellar tendon. ?No change of the medial or lateral meniscus. ?  ?Summary: No structural changes appreciated ?  ?PATIENT SURVEYS:  ?LEFS 57/80   (GOAL 48/80) ?  ?COGNITION: ?         Overall cognitive status: Within functional limits for tasks assessed              ?          ?SENSATION: ?         Light touch:  Deficits knee tingles at times  ?          ?  ?PALPATION: ?General pain and soreness surrounding patella and medial /lateral joint line ,  none in post knee ?  ?LE AROM/PROM: ?  ?A/PROM Right ?07/16/2021 Left ?07/16/2021  ?Hip flexion 90 deg pain in post L hip  WFL  ?Hip extension      ?Hip abduction      ?Hip adduction      ?Hip internal rotation Pain  WFL  ?Hip external rotation Pain  WFL  ?Knee flexion 84 130  ?Knee extension 5 0  ?Ankle dorsiflexion      ?Ankle plantarflexion      ?Ankle inversion      ?Ankle eversion      ? (Blank rows = not tested) ?  ?LE MMT: ?  ?MMT Right ?07/16/2021 Left ?07/16/2021  ?Hip flexion 5/5    ?Hip extension      ?Hip abduction      ?Hip adduction      ?Hip internal rotation      ?Hip external rotation      ?Knee  flexion 4+/5    ?Knee extension 3+/5 pain and fearful     ?Ankle dorsiflexion 5/5    ?Ankle plantarflexion      ?Ankle inversion      ?Ankle eversion      ? (Blank rows = not tested) ?  ?  ?FUNCTIONAL TESTS:  ?5 times sit to stand: 22 sec , decr WB on RLE  ?SLS Rt LE 5 sec with pain , LLE WFL  ?  ?GAIT: ?Distance walked: 150 ?Assistive device utilized: None ?Level of assistance: Modified independence ?Comments: limp, antalgic gait with brace  ?  ?  ?  ?TODAY'S TREATMENT: ?[facility] Adult PT Treatment:                                                DATE: 07/27/21 ?Therapeutic Exercise: ? ?Recumbent bike L1 for 6 min  ?Supine Quad Set 5 sec x 10  ?Supine Active Straight Leg Raise x 15  ?Supine Heel Slide with Strap to 129 deg  ?Hamstring stretch 30 sec x 3  ?Hamstring bridge 5 sec x 10 ?Supine knee extension with red loop x 20  x 2 sets  ?VMO SLR x 10 propped on elbows  ?Seated Long Arc Quad Rt LE with ball holding for alignment 4 lbs x 15, 6 lbs x 15  ?Sit to stand No UEs x 10  ?Wall siit 30 sec x 3 toe press, then 30 sec x 3 with heel press , alt. Toe/heel taps ?Small step ups 4 inch no UEs , then 8 inch x 15  ?Treadmill up to 2.0 mph , 4 % grade for 5 min  ?Modalities: ?Ice pack  declined  ?Self Care: ?Popping of knee, ask MD. Pushing with caution, avoid    ?PATIENT EDUCATION:  ?Education details: HEP ?Person educated: Patient ?Education method: Explanation and Handouts ?Education comprehension: verbalized understanding, returned demonstration, and needs further education ?  ?  ?HOME EXERCISE PROGRAM: ?Access Code: [email or web address] ?URL: [email or web address] ?Date: 07/16/2021 ?Prepared by: Raeford Razor ?  ?Exercises ?Supine Quad Set - 3-5 x daily - 7 x weekly - 2 sets - 10 reps - 30 hold ?Seated Long Arc Quad - 3 x daily - 7 x weekly - 2 sets - 10 reps - 5 hold ?Supine Heel Slide with Strap - 2 x daily - 7 x weekly - 1 sets - 10 reps - 10-15 hold ?Supine Single Knee to Chest Stretch - 2 x daily - 7 x weekly - 2 sets - 10 reps - 5 hold ?Supine Active Straight Leg Raise - 2 x daily - 7 x weekly - 2 sets - 10 reps - 5 hold ? Wall sit  ?  ?ASSESSMENT: ?  ?CLINICAL IMPRESSION: ?Patient presents for first PT session for Rt knee injury.  She did well, able to complete with min increase in knee pain .  She has not been back to the gym for even upper body exercise or biking but plans to today.  She had popping in Rt knee every time she lowered from the step.  Sees MD tomorrow.  Asked her to as about the popping and how much to push  ? ?  ?  ?OBJECTIVE IMPAIRMENTS decreased activity tolerance, decreased endurance, decreased knowledge of use of DME, decreased mobility, difficulty walking, decreased ROM, decreased strength, hypomobility, increased edema, increased fascial restrictions, impaired flexibility,  postural dysfunction, and pain.  ?  ?ACTIVITY LIMITATIONS cleaning, community activity, meal prep, occupation, and shopping.  ?  ?PERSONAL FACTORS Profession are also affecting patient's functional outcome.  ?  ?  ?REHAB POTENTIAL: Excellent ?  ?CLINICAL DECISION MAKING: Stable/uncomplicated ?  ?EVALUATION COMPLEXITY: Low ?  ?  ?GOALS: ?Goals reviewed with patient? Yes ?  ?SHORT TERM GOALS: ?   ?Pt will be able to show I with HEP for Rt knee and hips ?Baseline: given on eval  ?Target date: 08/13/2021 ?Goal status: INITIAL ?  ?2.  Pt will be able to sit with Rt knee to 90 deg for normal sit to stand mechanics and transfers ?Baseline: 84 deg sitting ?Target date: 08/13/2021 ?Goal status: INITIAL ?  ?3.  Pt will be able to stand and work a full day with pain controlled < 5/10 with brace if needed  ?Baseline: 8/10 after work with brace  ?Target date: 08/13/2021 ?Goal status: INITIAL ?  ?4.  Pt will be able to walk in her home and the clinic without brace and no feelings of instability  ?Baseline: has had her knee buckle at times since injury ?Target date: 08/13/2021 ?Goal status: INITIAL ?  ?LONG TERM GOALS: ?  ?Pt will be able to improve LEFS score to 48/80 to demo improved LE function.  ?Baseline: 57 % ?Target date: 08/13/2021 ?Goal status: INITIAL ?  ?2.  Pt will be able to improve sit to stand time to < 12 sec  ?Baseline: 22 sec  ?Target date: 09/10/2021 ?Goal status: INITIAL ?  ?3.  Pt will demo 5/5 strength in Rt knee and hip for maximal stability with walking, exercise  ?Baseline: quads , painful 4-/5, weak , pain in Rt hip  ?Target date: 09/10/2021 ?Goal status: INITIAL ?  ?4.  Pt will no longer need the brace for work, walking in the community  ?Baseline: uses when standing most of the time even in home  ?Target date: 09/10/2021 ?Goal status: INITIAL ?  ?5.  Pt will be Independent with HEP for knee, hip and return to gym workouts without increased pain  ?Baseline: unknown ?Target date: 09/10/2021 ?Goal status: INITIAL ?  ?6.  Pt will be able to go up and down steps with 1 rail, reciprocally with good knee control  ?Baseline: one at a time , 1 rail  ?Target date: 09/10/2021 ?Goal status: INITIAL ?PLAN: ?PT FREQUENCY: 2x/week ?  ?PT DURATION: 8 weeks ?  ?PLANNED INTERVENTIONS: Therapeutic exercises, Therapeutic activity, Neuromuscular re-education, Balance training, Gait training, Patient/Family education, Joint  mobilization, Stair training, Aquatic Therapy, Dry Needling, Cryotherapy, Moist heat, Taping, and Manual therapy ?  ?PLAN FOR NEXT SESSION: cont POC. ?HEP, bike, add hamstring strength ,progress to standing as tolerated

## 2021-07-31 ENCOUNTER — Ambulatory Visit: Payer: Medicaid Other | Admitting: Physical Therapy

## 2021-08-02 NOTE — Therapy (Signed)
?OUTPATIENT PHYSICAL THERAPY TREATMENT NOTE ? ? ?Patient Name: Loretta Ellis ?MRN: [id number] ?DOB:May 16, 1985, 36 y.o., female ?Today's Date: 08/03/2021 ? ?PCP: [facility] ?REFERRING PROVIDER: Hiram Gash, MD ? ? PT End of Session - 08/03/21 0806   ? ? Visit Number 3   ? Number of Visits 16   ? Date for PT Re-Evaluation 09/10/21   ? Authorization Type [employer]   ? PT Start Time [phone]   ? PT Stop Time 0845   ? PT Time Calculation (min) 42 min   ? Activity Tolerance Patient tolerated treatment well   ? Behavior During Therapy [facility] for tasks assessed/performed   ? ?  ?  ? ?  ? ? ? ?Past Medical History:  ?Diagnosis Date  ? Anemia   ? Chlamydia   ? Gonorrhea   ? Heart murmur   ? No pertinent past medical history   ? Pregnancy induced hypertension   ? ?Past Surgical History:  ?Procedure Laterality Date  ? ADENOIDECTOMY    ? EAR TUBE REMOVAL    ? TONSILLECTOMY    ? ?Patient Active Problem List  ? Diagnosis Date Noted  ? Sprain of posterior cruciate ligament of right knee 06/08/2021  ? Normal labor 02/10/2016  ? ? ?REFERRING DIAG: M25.561 (ICD-10-CM) - Lateral knee pain, right  ? ?THERAPY DIAG:  ?Acute pain of right knee ? ?Stiffness of right knee, not elsewhere classified ? ?Localized edema ? ?Difficulty in walking, not elsewhere classified ? ?PERTINENT HISTORY: Patient with an injury to her right knee and anterior part of her upper shin 3 weeks ago during flag football.  Patient thought it would get better.  But it has not.  As she has some increased discomfort in the area.  Denies any knee swelling.  The knee giving out.  ? ?PRECAUTIONS: none ? ?SUBJECTIVE: ? ?I had an injection Tues and that helped.  If it  is still popping in 8 weeks they may go in and scope it.  No more pain .   ? ?PAIN:  ?Are you having pain? No ? ? ? ? ? ?(DIAGNOSTIC FINDINGS: No effusion. ?Normal-appearing quadricep patellar tendon. ?No change of the medial or lateral meniscus. ?  ?Summary: No structural changes appreciated ?   ?PATIENT SURVEYS:  ?LEFS 57/80   (GOAL 48/80) ?  ?COGNITION: ?         Overall cognitive status: Within functional limits for tasks assessed              ?          ?SENSATION: ?         Light touch: Deficits knee tingles at times  ?          ?  ?PALPATION: ?General pain and soreness surrounding patella and medial /lateral joint line ,  none in post knee ?  ?LE AROM/PROM: ?  ?A/PROM Right ?07/16/2021 Left ?07/16/2021  ?Hip flexion 90 deg pain in post L hip  WFL  ?Hip extension      ?Hip abduction      ?Hip adduction      ?Hip internal rotation Pain  WFL  ?Hip external rotation Pain  WFL  ?Knee flexion 84 130  ?Knee extension 5 0  ?Ankle dorsiflexion      ?Ankle plantarflexion      ?Ankle inversion      ?Ankle eversion      ? (Blank rows = not tested) ?  ?LE MMT: ?  ?  MMT Right ?07/16/2021 Left ?07/16/2021  ?Hip flexion 5/5    ?Hip extension      ?Hip abduction      ?Hip adduction      ?Hip internal rotation      ?Hip external rotation      ?Knee flexion 4+/5    ?Knee extension 3+/5 pain and fearful     ?Ankle dorsiflexion 5/5    ?Ankle plantarflexion      ?Ankle inversion      ?Ankle eversion      ? (Blank rows = not tested) ?  ?  ?FUNCTIONAL TESTS:  ?5 times sit to stand: 22 sec , decr WB on RLE  ?SLS Rt LE 5 sec with pain , LLE WFL  ?  ?GAIT: ?Distance walked: 150 ?Assistive device utilized: None ?Level of assistance: Modified independence ?Comments: limp, antalgic gait with brace  ?  ?[facility] Adult PT Treatment:                                                DATE: 08/03/21 ?Therapeutic Exercise: ?Elliptical for 5 min Level 8-10 ramp, level 2 resistance  ?Slantboard 1 min bilateral calf ?Partial squat x 10 with hand support ?Partial squat without UEs x 10, min cues for form ?Split squat x 10 Rt LE  ?SLS on floor 3 x 20 sec  ?Narrow heel raise x 15 no UE ?Seated LAQ 5 lbs x 15  ?Hamstring isometric x 10 sec x 5  ?Bridge x 10   ?Bridge with march x 10  ?Hamstring x 3, 30 sec  ?Wall sit with 15 lbs 30 sec x 3  ?  Manual: ? McConnell tape to Rt patella pulling medially  ? ? ?[facility] Adult PT Treatment:                                                DATE: 07/27/21 ?Therapeutic Exercise: ? ?Recumbent bike L1 for 6 min  ?Supine Quad Set 5 sec x 10  ?Supine Active Straight Leg Raise x 15  ?Supine Heel Slide with Strap to 129 deg  ?Hamstring stretch 30 sec x 3  ?Hamstring bridge 5 sec x 10 ?Supine knee extension with red loop x 20  x 2 sets  ?VMO SLR x 10 propped on elbows  ?Seated Long Arc Quad Rt LE with ball holding for alignment 4 lbs x 15, 6 lbs x 15  ?Sit to stand No UEs x 10  ?Wall siit 30 sec x 3 toe press, then 30 sec x 3 with heel press , alt. Toe/heel taps ?Small step ups 4 inch no UEs , then 8 inch x 15  ?Treadmill up to 2.0 mph , 4 % grade for 5 min  ?Modalities: ?Ice pack declined  ?Self Care: ?Popping of knee, ask MD. Pushing with caution, avoid    ?PATIENT EDUCATION:  ?Education details: HEP ?Person educated: Patient ?Education method: Explanation and Handouts ?Education comprehension: verbalized understanding, returned demonstration, and needs further education ?  ?  ?HOME EXERCISE PROGRAM: ?Access Code: [email or web address] ?URL: [email or web address] ?Date: 07/16/2021 ?Prepared by: Karie Mainland ?ASSESSMENT: ?  ?CLINICAL IMPRESSION: ?Patient reports improvement since her injection last week.  She cont to feel popping in her knee with  step ups since then .  She has some mild discomfort and instability in standing.  Tapes Rt Patella to pull medially .  ?  ?  ?OBJECTIVE IMPAIRMENTS decreased activity tolerance, decreased endurance, decreased knowledge of use of DME, decreased mobility, difficulty walking, decreased ROM, decreased strength, hypomobility, increased edema, increased fascial restrictions, impaired flexibility, postural dysfunction, and pain.  ?  ?ACTIVITY LIMITATIONS cleaning, community activity, meal prep, occupation, and shopping.  ?  ?PERSONAL FACTORS Profession are also affecting patient's functional  outcome.  ?  ?  ?REHAB POTENTIAL: Excellent ?  ?CLINICAL DECISION MAKING: Stable/uncomplicated ?  ?EVALUATION COMPLEXITY: Low ?  ?  ?GOALS: ?Goals reviewed with patient? Yes ?  ?SHORT TERM GOALS: ?  ?Pt will be able to show I with HEP for Rt knee and hips ?Baseline: given on eval  ?Target date: 08/13/2021 ?Goal status: INITIAL ?  ?2.  Pt will be able to sit with Rt knee to 90 deg for normal sit to stand mechanics and transfers ?Baseline: 84 deg sitting ?Target date: 08/13/2021 ?Goal status: INITIAL ?  ?3.  Pt will be able to stand and work a full day with pain controlled < 5/10 with brace if needed  ?Baseline: 8/10 after work with brace  ?Target date: 08/13/2021 ?Goal status: INITIAL ?  ?4.  Pt will be able to walk in her home and the clinic without brace and no feelings of instability  ?Baseline: has had her knee buckle at times since injury ?Target date: 08/13/2021 ?Goal status: INITIAL ?  ?LONG TERM GOALS: ?  ?Pt will be able to improve LEFS score to 48/80 to demo improved LE function.  ?Baseline: 57 % ?Target date: 08/13/2021 ?Goal status: INITIAL ?  ?2.  Pt will be able to improve sit to stand time to < 12 sec  ?Baseline: 22 sec  ?Target date: 09/10/2021 ?Goal status: INITIAL ?  ?3.  Pt will demo 5/5 strength in Rt knee and hip for maximal stability with walking, exercise  ?Baseline: quads , painful 4-/5, weak , pain in Rt hip  ?Target date: 09/10/2021 ?Goal status: INITIAL ?  ?4.  Pt will no longer need the brace for work, walking in the community  ?Baseline: uses when standing most of the time even in home  ?Target date: 09/10/2021 ?Goal status: INITIAL ?  ?5.  Pt will be Independent with HEP for knee, hip and return to gym workouts without increased pain  ?Baseline: unknown ?Target date: 09/10/2021 ?Goal status: INITIAL ?  ?6.  Pt will be able to go up and down steps with 1 rail, reciprocally with good knee control  ?Baseline: one at a time , 1 rail  ?Target date: 09/10/2021 ?Goal status: INITIAL ?PLAN: ?PT FREQUENCY: 2x/week ?   ?PT DURATION: 8 weeks ?  ?PLANNED INTERVENTIONS: Therapeutic exercises, Therapeutic activity, Neuromuscular re-education, Balance training, Gait training, Patient/Family education, Joint mobilization, Stair traini

## 2021-08-03 ENCOUNTER — Ambulatory Visit: Payer: Medicaid Other | Admitting: Physical Therapy

## 2021-08-03 ENCOUNTER — Other Ambulatory Visit: Payer: Self-pay

## 2021-08-03 DIAGNOSIS — R262 Difficulty in walking, not elsewhere classified: Secondary | ICD-10-CM

## 2021-08-03 DIAGNOSIS — M25561 Pain in right knee: Secondary | ICD-10-CM | POA: Diagnosis not present

## 2021-08-03 DIAGNOSIS — M25661 Stiffness of right knee, not elsewhere classified: Secondary | ICD-10-CM

## 2021-08-03 DIAGNOSIS — R600 Localized edema: Secondary | ICD-10-CM

## 2021-08-06 ENCOUNTER — Ambulatory Visit: Payer: Medicaid Other | Admitting: Physical Therapy

## 2021-08-07 NOTE — Therapy (Addendum)
OUTPATIENT PHYSICAL THERAPY TREATMENT NOTE DISCHARGE   Patient Name: Loretta Ellis MRN: [id number] DOB:1985/07/24, 36 y.o., female Today's Date: 08/10/2021  PCP: [facility] [place] [place] of [facility] - 08/10/21 8099     Visit Number 4    Number of Visits 16    Date for PT Re-Evaluation 09/10/21    Authorization Type [employer]    PT Start Time [phone]               Past Medical History:  Diagnosis Date   Anemia    Chlamydia    Gonorrhea    Heart murmur    No pertinent past medical history    Pregnancy induced hypertension    Past Surgical History:  Procedure Laterality Date   ADENOIDECTOMY     EAR TUBE REMOVAL     TONSILLECTOMY     Patient Active Problem List   Diagnosis Date Noted   Sprain of posterior cruciate ligament of right knee 06/08/2021   Normal labor 02/10/2016    REFERRING DIAG: M25.561 (ICD-10-CM) - Lateral knee pain, right   THERAPY DIAG:  Acute pain of right knee  Stiffness of right knee, not elsewhere classified  Localized edema  Difficulty in walking, not elsewhere classified  PERTINENT HISTORY: Patient with an injury to her right knee and anterior part of her upper shin 3 weeks ago during flag football.  Patient thought it would get better.  But it has not.  As she has some increased discomfort in the area.  Denies any knee swelling.  The knee giving out.   PRECAUTIONS: none  SUBJECTIVE:  No pain.  I worked on squats and it was fine.   Has been walking every day .   PAIN:  Are you having pain? No      (DIAGNOSTIC FINDINGS: No effusion. Normal-appearing quadricep patellar tendon. No change of the medial or lateral meniscus.   Summary: No structural changes appreciated   PATIENT SURVEYS:  LEFS 57/80   (GOAL 48/80)   COGNITION:          Overall cognitive status: Within functional limits for tasks assessed                        SENSATION:          Light touch:  Deficits knee tingles at times              PALPATION: General pain and soreness surrounding patella and medial /lateral joint line ,  none in post knee   LE AROM/PROM:   A/PROM Right 07/16/2021 Left 07/16/2021  Hip flexion 90 deg pain in post L hip  [facility]  Hip extension      Hip abduction      Hip adduction      Hip internal rotation Pain  WFL  Hip external rotation Pain  WFL  Knee flexion 84 130  Knee extension 5 0  Ankle dorsiflexion      Ankle plantarflexion      Ankle inversion      Ankle eversion       (Blank rows = not tested)   LE MMT:   MMT Right 07/16/2021 Left 07/16/2021  Hip flexion 5/5    Hip extension      Hip abduction      Hip adduction      Hip internal rotation      Hip external rotation  Knee flexion 4+/5    Knee extension 3+/5 pain and fearful     Ankle dorsiflexion 5/5    Ankle plantarflexion      Ankle inversion      Ankle eversion       (Blank rows = not tested)     FUNCTIONAL TESTS:  5 times sit to stand: 22 sec , decr WB on RLE  SLS Rt LE 5 sec with pain , LLE WFL    GAIT: Distance walked: 150 Assistive device utilized: None Level of assistance: Modified independence Comments: limp, antalgic gait with brace    [facility] Adult PT Treatment:                                                DATE: 08/10/21 Therapeutic Exercise: Elliptical 5 min Level 10 ramp and level 1 ramp Knee extension full ROM 10 lbs x 15, single LE Rt x 10 , eccentric 20 lbs x 10  Knee flexion 25 lbs x 15 then 5 sec hold x 5  Leg press 60 lbs x 10 then 80 lbs x 10 double leg  Slider lunge along wall, no wgt then 15 lbs x 10 each LE  TRX: squat (double, single) , curtsy squat and hip hinge to Warrior III x 10 each side  Single leg (stagger) sit to stand 15 lbs x 10 each side SLS with UE theraband (on foam) with row and then forward flexion (green)  Sidefacing single leg Palloff press x 10 black band Rt LE each direction    [facility] Adult PT Treatment:                                                 DATE: 08/03/21 Therapeutic Exercise: Elliptical for 5 min Level 8-10 ramp, level 2 resistance  Slantboard 1 min bilateral calf Partial squat x 10 with hand support Partial squat without UEs x 10, min cues for form Split squat x 10 Rt LE  SLS on floor 3 x 20 sec  Narrow heel raise x 15 no UE Seated LAQ 5 lbs x 15  Hamstring isometric x 10 sec x 5  Bridge x 10   Bridge with march x 10  Hamstring x 3, 30 sec  Wall sit with 15 lbs 30 sec x 3   Manual:  McConnell tape to Rt patella pulling medially    [facility] Adult PT Treatment:                                                DATE: 07/27/21 Therapeutic Exercise:  Recumbent bike L1 for 6 min  Supine Quad Set 5 sec x 10  Supine Active Straight Leg Raise x 15  Supine Heel Slide with Strap to 129 deg  Hamstring stretch 30 sec x 3  Hamstring bridge 5 sec x 10 Supine knee extension with red loop x 20  x 2 sets  VMO SLR x 10 propped on elbows  Seated Long Arc Quad Rt LE with ball holding for alignment 4 lbs x 15, 6 lbs x 15  Sit to  stand No UEs x 10  Wall siit 30 sec x 3 toe press, then 30 sec x 3 with heel press , alt. Toe/heel taps Small step ups 4 inch no UEs , then 8 inch x 15  Treadmill up to 2.0 mph , 4 % grade for 5 min  Modalities: Ice pack declined  Self Care: Popping of knee, ask MD. Pushing with caution, avoid    PATIENT EDUCATION:  Education details: HEP Person educated: Patient Education method: Theatre stage manager Education comprehension: verbalized understanding, returned demonstration, and needs further education     HOME EXERCISE PROGRAM: Access Code: [email or web address] URL: [email or web address] Date: 07/16/2021 Prepared by: Raeford Razor ASSESSMENT:   CLINICAL IMPRESSION: Patient continued to progress her mobility and level of activity.  She reports having min experience with weight training despite high level of sport.  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  Ice post session.    OBJECTIVE IMPAIRMENTS decreased activity tolerance, decreased endurance, decreased knowledge of use of DME, decreased mobility, difficulty walking, decreased ROM, decreased strength, hypomobility, increased edema, increased fascial restrictions, impaired flexibility, postural dysfunction, and pain.    ACTIVITY LIMITATIONS cleaning, community activity, meal prep, occupation, and shopping.    PERSONAL FACTORS Profession are also affecting patient's functional outcome.      REHAB POTENTIAL: Excellent   CLINICAL DECISION MAKING: Stable/uncomplicated   EVALUATION COMPLEXITY: Low     GOALS: Goals reviewed with patient? Yes   SHORT TERM GOALS:   Pt will be able to show I with HEP for Rt knee and hips Baseline: given on eval  Target date: 08/13/2021 Goal status:met    2.  Pt will be able to sit with Rt knee to 90 deg for normal sit to stand mechanics and transfers Baseline: 84 deg sitting Target date: 08/13/2021 Goal status: met   3.  Pt will be able to stand and work a full day with pain controlled < 5/10 with brace if needed  Baseline: 6/10 after work with and without brace  Target date: 08/13/2021 Goal status: met   4.  Pt will be able to walk in her home and the clinic without brace and no feelings of instability  Baseline: has had her knee buckle at times since injury Target date: 08/13/2021 Goal status: met    LONG TERM GOALS:   Pt will be able to improve LEFS score to 48/80 to demo improved LE function.  Baseline: 57 % Target date: 08/13/2021 Goal status: INITIAL   2.  Pt will be able to improve sit to stand time to < 12 sec  Baseline: 22 sec  Target date: 09/10/2021 Goal status: INITIAL   3.  Pt will demo 5/5 strength in Rt knee and hip for maximal stability with walking, exercise  Baseline: quads , painful 4-/5, weak , pain in Rt hip  Target date: 09/10/2021 Goal status: INITIAL   4.  Pt will no longer  need the brace for work, walking in the community  Baseline: uses when standing most of the time even in home  Target date: 09/10/2021 Goal status: INITIAL   5.  Pt will be Independent with HEP for knee, hip and return to gym workouts without increased pain  Baseline: unknown Target date: 09/10/2021 Goal status: INITIAL   6.  Pt will be able to go up and down steps with 1 rail, reciprocally with good knee control  Baseline: one at a time ,  1 rail  Target date: 09/10/2021 Goal status: INITIAL PLAN: PT FREQUENCY: 2x/week   PT DURATION: 8 weeks   PLANNED INTERVENTIONS: Therapeutic exercises, Therapeutic activity, Neuromuscular re-education, Balance training, Gait training, Patient/Family education, Joint mobilization, Stair training, Aquatic Therapy, Dry Needling, Cryotherapy, Moist heat, Taping, and Manual therapy   PLAN FOR NEXT SESSION: cont POC. HEP, bike, add hamstring strength ,progress to standing as tolerated, ice post?  Quad needed for support, excess pull on hamstring may aggravate.    Matilynn Dacey, PT 08/10/2021, 9:30 AM    Raeford Razor, PT 08/10/21 9:30 AM Phone: [phone] Fax: [phone]   PHYSICAL THERAPY DISCHARGE SUMMARY  Visits from Start of Care: 4  Current functional level related to goals / functional outcomes: unknown   Remaining deficits: Unknown   Education / Equipment: HEP and gym ex   Patient agrees to discharge. Patient goals were  unknown . Patient is being discharged due to not returning since the last visit.  Raeford Razor, PT 10/13/21 10:58 AM Phone: [phone] Fax: [phone]

## 2021-08-10 ENCOUNTER — Ambulatory Visit: Payer: Medicaid Other | Attending: Orthopaedic Surgery | Admitting: Physical Therapy

## 2021-08-10 ENCOUNTER — Encounter: Payer: Self-pay | Admitting: Physical Therapy

## 2021-08-10 DIAGNOSIS — R262 Difficulty in walking, not elsewhere classified: Secondary | ICD-10-CM | POA: Insufficient documentation

## 2021-08-10 DIAGNOSIS — R600 Localized edema: Secondary | ICD-10-CM | POA: Insufficient documentation

## 2021-08-10 DIAGNOSIS — M25561 Pain in right knee: Secondary | ICD-10-CM | POA: Diagnosis present

## 2021-08-10 DIAGNOSIS — M25661 Stiffness of right knee, not elsewhere classified: Secondary | ICD-10-CM | POA: Insufficient documentation

## 2021-08-13 ENCOUNTER — Encounter: Payer: Medicaid Other | Admitting: Physical Therapy

## 2021-08-18 ENCOUNTER — Ambulatory Visit: Payer: Medicaid Other | Admitting: Physical Therapy

## 2021-08-21 ENCOUNTER — Ambulatory Visit: Payer: Medicaid Other | Admitting: Physical Therapy

## 2022-07-05 ENCOUNTER — Ambulatory Visit (INDEPENDENT_AMBULATORY_CARE_PROVIDER_SITE_OTHER): Payer: Medicaid Other

## 2022-07-05 ENCOUNTER — Ambulatory Visit (INDEPENDENT_AMBULATORY_CARE_PROVIDER_SITE_OTHER): Payer: Medicaid Other | Admitting: Orthopedic Surgery

## 2022-07-05 DIAGNOSIS — M25561 Pain in right knee: Secondary | ICD-10-CM

## 2022-07-05 DIAGNOSIS — M659 Synovitis and tenosynovitis, unspecified: Secondary | ICD-10-CM | POA: Diagnosis not present

## 2022-07-06 ENCOUNTER — Encounter: Payer: Self-pay | Admitting: Orthopedic Surgery

## 2022-07-06 MED ORDER — LIDOCAINE HCL 1 % IJ SOLN
5.0000 mL | INTRAMUSCULAR | Status: AC | PRN
  Administered 2022-07-05: 5 mL

## 2022-07-06 MED ORDER — METHYLPREDNISOLONE ACETATE 40 MG/ML IJ SUSP
40.0000 mg | INTRAMUSCULAR | Status: AC | PRN
  Administered 2022-07-05: 40 mg via INTRA_ARTICULAR

## 2022-07-06 MED ORDER — BUPIVACAINE HCL 0.25 % IJ SOLN
4.0000 mL | INTRAMUSCULAR | Status: AC | PRN
  Administered 2022-07-05: 4 mL via INTRA_ARTICULAR

## 2022-07-06 NOTE — Progress Notes (Signed)
Office Visit Note   Patient: Loretta Ellis           Date of Birth: 06-12-1985           MRN: [id number] Visit Date: 07/05/2022 Requested by: [facility] [address] [place],  [place] [address] PCP: [facility]  Subjective: Chief Complaint  Patient presents with   Right Knee - Pain    HPI: Loretta Ellis is a 37 y.o. female who presents to the office reporting right knee pain.  Patient sustained an injury early in 2023.  MRI at that time showed no structural damage in the knee.  She reinjured the knee several weeks ago playing flag football.  She had a hyperflexion injury to the knee and describes pain and mechanical symptoms since then.  She works as a Theme park manager.  This was a collision playing flag football contact injury.  She has been using a knee brace.  States that the knee buckles and locks up.  No prior knee surgery..                ROS: All systems reviewed are negative as they relate to the chief complaint within the history of present illness.  Patient denies fevers or chills.  Assessment & Plan: Visit Diagnoses:  1. Right knee pain, unspecified chronicity     Plan: Impression is right knee pain with history of prior injury with negative MRI scan.  Current injury suggest possible meniscal pathology based on hyperflexion type injury.  Plan is cortisone injection today into the knee with MRI scan to evaluate possible meniscal damage particularly posterior horn medial meniscus.  Follow-up after that study.  She has tried activity modification bracing as well as anti-inflammatories which have not helped.  Follow-Up Instructions: No follow-ups on file.   Orders:  Orders Placed This Encounter  Procedures   XR KNEE 3 VIEW RIGHT   MR Knee Right w/o contrast   No orders of the defined types were placed in this encounter.     Procedures: Large Joint Inj: R knee on 07/05/2022 10:17 PM Indications: diagnostic evaluation, joint swelling and  pain Details: 18 G 1.5 in needle, superolateral approach  Arthrogram: No  Medications: 5 mL lidocaine 1 %; 40 mg methylPREDNISolone acetate 40 MG/ML; 4 mL bupivacaine 0.25 % Outcome: tolerated well, no immediate complications Procedure, treatment alternatives, risks and benefits explained, specific risks discussed. Consent was given by the patient. Immediately prior to procedure a time out was called to verify the correct patient, procedure, equipment, support staff and site/side marked as required. Patient was prepped and draped in the usual sterile fashion.       Clinical Data: No additional findings.  Objective: Vital Signs: There were no vitals taken for this visit.  Physical Exam:  Constitutional: Patient appears well-developed HEENT:  Head: Normocephalic Eyes:EOM are normal Neck: Normal range of motion Cardiovascular: Normal rate Pulmonary/chest: Effort normal Neurologic: Patient is alert Skin: Skin is warm Psychiatric: Patient has normal mood and affect  Ortho Exam: Ortho exam demonstrates 6 normal gait alignment.  Patient does have positive McMurray compression testing for medial and lateral compartment pathology.  Collateral and cruciate ligaments are stable.  No masses lymphadenopathy or skin changes noted in that right knee region.  Negative patellar apprehension.  No effusion.  Extensor mechanism intact.  Range of motion full.  Specialty Comments:  No specialty comments available.  Imaging: No results found.   PMFS History: Patient Active Problem List   Diagnosis  Date Noted   Sprain of posterior cruciate ligament of right knee 06/08/2021   Normal labor 02/10/2016   Past Medical History:  Diagnosis Date   Anemia    Chlamydia    Gonorrhea    Heart murmur    No pertinent past medical history    Pregnancy induced hypertension     Family History  Problem Relation Age of Onset   Diabetes Mother    Hyperlipidemia Mother    Hypertension Mother    Stroke  Mother    Diabetes Father    Hyperlipidemia Father    Hypertension Father    Heart disease Maternal Grandfather    Anesthesia problems Neg Hx    Hypotension Neg Hx    Malignant hyperthermia Neg Hx    Pseudochol deficiency Neg Hx     Past Surgical History:  Procedure Laterality Date   ADENOIDECTOMY     EAR TUBE REMOVAL     TONSILLECTOMY     Social History   Occupational History   Not on file  Tobacco Use   Smoking status: Never   Smokeless tobacco: Never  Substance and Sexual Activity   Alcohol use: No   Drug use: No   Sexual activity: Yes    Birth control/protection: None

## 2022-07-20 ENCOUNTER — Ambulatory Visit
Admission: RE | Admit: 2022-07-20 | Discharge: 2022-07-20 | Disposition: A | Discharge status: Home / Self Care | Payer: Medicaid Other | Source: Ambulatory Visit | Attending: Orthopedic Surgery | Admitting: Orthopedic Surgery

## 2022-07-20 DIAGNOSIS — M25561 Pain in right knee: Secondary | ICD-10-CM

## 2022-07-25 ENCOUNTER — Emergency Department (HOSPITAL_BASED_OUTPATIENT_CLINIC_OR_DEPARTMENT_OTHER): Payer: Medicaid Other

## 2022-07-25 ENCOUNTER — Emergency Department (HOSPITAL_BASED_OUTPATIENT_CLINIC_OR_DEPARTMENT_OTHER)
Admission: EM | Admit: 2022-07-25 | Discharge: 2022-07-25 | Disposition: A | Discharge status: Home / Self Care | Payer: Medicaid Other | Attending: Emergency Medicine | Admitting: Emergency Medicine

## 2022-07-25 ENCOUNTER — Encounter (HOSPITAL_BASED_OUTPATIENT_CLINIC_OR_DEPARTMENT_OTHER): Payer: Self-pay

## 2022-07-25 ENCOUNTER — Other Ambulatory Visit: Payer: Self-pay

## 2022-07-25 DIAGNOSIS — K6389 Other specified diseases of intestine: Secondary | ICD-10-CM

## 2022-07-25 DIAGNOSIS — K659 Peritonitis, unspecified: Secondary | ICD-10-CM | POA: Insufficient documentation

## 2022-07-25 DIAGNOSIS — R1031 Right lower quadrant pain: Secondary | ICD-10-CM | POA: Diagnosis present

## 2022-07-25 LAB — PREGNANCY, URINE: Preg Test, Ur: NEGATIVE

## 2022-07-25 LAB — COMPREHENSIVE METABOLIC PANEL
ALT: 9 U/L (ref 0–44)
AST: 13 U/L — ABNORMAL LOW (ref 15–41)
Albumin: 4.4 g/dL (ref 3.5–5.0)
Alkaline Phosphatase: 80 U/L (ref 38–126)
Anion gap: 7 (ref 5–15)
BUN: 11 mg/dL (ref 6–20)
CO2: 25 mmol/L (ref 22–32)
Calcium: 9.6 mg/dL (ref 8.9–10.3)
Chloride: 106 mmol/L (ref 98–111)
Creatinine, Ser: 0.76 mg/dL (ref 0.44–1.00)
GFR, Estimated: >60 mL/min (ref >60)
Glucose, Bld: 96 mg/dL (ref 70–99)
Potassium: 4.1 mmol/L (ref 3.5–5.1)
Sodium: 138 mmol/L (ref 135–145)
Total Bilirubin: 0.3 mg/dL (ref 0.3–1.2)
Total Protein: 7.6 g/dL (ref 6.5–8.1)

## 2022-07-25 LAB — CBC
HCT: 36.5 % (ref 36.0–46.0)
Hemoglobin: 11.9 g/dL — ABNORMAL LOW (ref 12.0–15.0)
MCH: 27.7 pg (ref 26.0–34.0)
MCHC: 32.6 g/dL (ref 30.0–36.0)
MCV: 85.1 fL (ref 80.0–100.0)
Platelets: 410 10*3/uL — ABNORMAL HIGH (ref 150–400)
RBC: 4.29 MIL/uL (ref 3.87–5.11)
RDW: 14.4 % (ref 11.5–15.5)
WBC: 5.9 10*3/uL (ref 4.0–10.5)
nRBC: 0 % (ref 0.0–0.2)

## 2022-07-25 LAB — URINALYSIS, ROUTINE W REFLEX MICROSCOPIC
Bacteria, UA: NONE SEEN
Bilirubin Urine: NEGATIVE
Glucose, UA: NEGATIVE mg/dL
Hgb urine dipstick: NEGATIVE
Ketones, ur: NEGATIVE mg/dL
Nitrite: NEGATIVE
Protein, ur: NEGATIVE mg/dL
Specific Gravity, Urine: 1.012 (ref 1.005–1.030)
pH: 7 (ref 5.0–8.0)

## 2022-07-25 LAB — LIPASE, BLOOD: Lipase: 22 U/L (ref 11–51)

## 2022-07-25 MED ORDER — HYDROMORPHONE HCL 1 MG/ML IJ SOLN
1.0000 mg | Freq: Once | INTRAMUSCULAR | Status: AC
  Administered 2022-07-25: 1 mg via INTRAMUSCULAR
  Filled 2022-07-25: qty 1

## 2022-07-25 MED ORDER — IOHEXOL 300 MG/ML  SOLN
100.0000 mL | Freq: Once | INTRAMUSCULAR | Status: AC | PRN
  Administered 2022-07-25: 80 mL via INTRAVENOUS

## 2022-07-25 NOTE — ED Provider Notes (Signed)
Patient signed out to me awaiting CT scan.  Lower abdominal pain.  CT scan showed normal appendix.  Ultrasound already unremarkable.  Overall radiology report states may be very mild epiploic appendagitis of the distal descending colon.  Overall she is feeling much better.  She was made aware of this.  Recommend continue Tylenol and ibuprofen at home.  Discharged in good condition.  This chart was dictated using voice recognition software.  Despite best efforts to proofread,  errors can occur which can change the documentation meaning.    Lennice Sites, DO 07/25/22 [id number]

## 2022-07-25 NOTE — ED Triage Notes (Signed)
Patient here POV from Home.  Endorses RLQ ABD Pain that began Last PM and continued through today. More Sharp and Constant.   Some nausea. No emesis. Loose Stool this AM. No Known Fever.   Went to UC and was sent for Assessment. NAD Noted during Triage. A&Ox4. GCS 15. BIB Wheelchair.

## 2022-07-25 NOTE — Discharge Instructions (Signed)
Overall you might have a very mild case the epiploic appendagitis.  This is when an area of small fat filled sacs of the outside of your colon and large intestine get inflamed/loss blood flow.  Overall this will resolve on its own and does not require any surgery or further care except for continued pain management with Tylenol and ibuprofen and time.  This is not life-threatening.

## 2022-07-25 NOTE — ED Provider Notes (Signed)
Loretta Ellis Note   CSN: [id number] Arrival date & time: 07/25/22  1139     History  Chief Complaint  Patient presents with   Abdominal Pain    Loretta Ellis is a 37 y.o. female.  37 year old female presents with sudden onset right lower quadrant Donnell pain which began yesterday evening.  Pain is characterized as sharp and worse with movements.  Denies any vaginal bleeding or discharge.  No fever or chills.  No prior history of same.  Last menstrual period was a week ago.  Went to urgent care center and sent here for evaluation of ovarian cyst versus appendicitis.  He was given Toradol prior to arrival by urgent care.  SHe notes limited relief with that       Home Medications Prior to Admission medications   Medication Sig Start Date End Date Taking? Authorizing Ellis  cyclobenzaprine (FLEXERIL) 10 MG tablet Take 1 tablet (10 mg total) by mouth 3 (three) times daily as needed for muscle spasms. 01/05/19   Lawyer, Harrell Gave, PA-C  HYDROcodone-acetaminophen (NORCO/VICODIN) 5-325 MG tablet Take 1 tablet by mouth every 8 (eight) hours as needed. Patient not taking: Reported on 07/16/2021 06/08/21   Rosemarie Ax, MD  ibuprofen (ADVIL,MOTRIN) 600 MG tablet Take 1 tablet (600 mg total) by mouth every 6 (six) hours as needed. Patient not taking: Reported on 01/05/2019 11/23/16   Seabron Spates, CNM  predniSONE (DELTASONE) 50 MG tablet Take 1 tablet (50 mg total) by mouth daily with breakfast. Patient not taking: Reported on 07/16/2021 01/05/19   Dalia Heading, PA-C      Allergies    Patient has no known allergies.    Review of Systems   Review of Systems  All other systems reviewed and are negative.   Physical Exam Updated Vital Signs BP (!) 141/93 (BP Location: Left Arm)   Pulse 64   Temp 98.6 F (37 C) (Oral)   Resp 18   Ht 1.753 m (5\' 9" )   Wt 98.4 kg   LMP 07/17/2022   SpO2 100%   BMI 32.05 kg/m   Physical Exam Vitals and nursing note reviewed.  Constitutional:      General: She is not in acute distress.    Appearance: Normal appearance. She is well-developed. She is not toxic-appearing.  HENT:     Head: Normocephalic and atraumatic.  Eyes:     General: Lids are normal.     Conjunctiva/sclera: Conjunctivae normal.     Pupils: Pupils are equal, round, and reactive to light.  Neck:     Thyroid: No thyroid mass.     Trachea: No tracheal deviation.  Cardiovascular:     Rate and Rhythm: Normal rate and regular rhythm.     Heart sounds: Normal heart sounds. No murmur heard.    No gallop.  Pulmonary:     Effort: Pulmonary effort is normal. No respiratory distress.     Breath sounds: Normal breath sounds. No stridor. No decreased breath sounds, wheezing, rhonchi or rales.  Abdominal:     General: There is no distension.     Palpations: Abdomen is soft.     Tenderness: There is abdominal tenderness in the right lower quadrant. There is guarding. There is no rebound.    Musculoskeletal:        General: No tenderness. Normal range of motion.     Cervical back: Normal range of motion and neck supple.  Skin:    General:  Skin is warm and dry.     Findings: No abrasion or rash.  Neurological:     Mental Status: She is alert and oriented to person, place, and time. Mental status is at baseline.     GCS: GCS eye subscore is 4. GCS verbal subscore is 5. GCS motor subscore is 6.     Cranial Nerves: No cranial nerve deficit.     Sensory: No sensory deficit.     Motor: Motor function is intact.  Psychiatric:        Attention and Perception: Attention normal.        Speech: Speech normal.        Behavior: Behavior normal.     ED Results / Procedures / Treatments   Labs (all labs ordered are listed, but only abnormal results are displayed) Labs Reviewed  COMPREHENSIVE METABOLIC PANEL - Abnormal; Notable for the following components:      Result Value   AST 13 (*)    All other  components within normal limits  CBC - Abnormal; Notable for the following components:   Hemoglobin 11.9 (*)    Platelets 410 (*)    All other components within normal limits  URINALYSIS, ROUTINE W REFLEX MICROSCOPIC - Abnormal; Notable for the following components:   Color, Urine COLORLESS (*)    Leukocytes,Ua TRACE (*)    All other components within normal limits  LIPASE, BLOOD  PREGNANCY, URINE    EKG None  Radiology No results found.  Procedures Procedures    Medications Ordered in ED Medications  HYDROmorphone (DILAUDID) injection 1 mg (has no administration in time range)    ED Course/ Medical Decision Making/ A&P                             Medical Decision Making Amount and/or Complexity of Data Reviewed Labs: ordered. Radiology: ordered.  Risk Prescription drug management.  Urine protect negative.  Urinalysis negative. She presented with acute onset of right lower quadrant abdominal pain.  Concern for possible appendicitis versus torsion versus ovarian cyst.  Patient has not had any vaginal bleeding or discharge.  Patient is pelvic ultrasound per my review and interpretation showed no evidence of torsion.  No evidence of cyst on the right side but small cyst on left side.  She was subsequently need to have an abdominal and pelvic CT to evaluate for possible appendicitis will sign off next Ellis       Final Clinical Impression(s) / ED Diagnoses Final diagnoses:  None    Rx / DC Orders ED Discharge Orders     None         Lacretia Leigh, MD 07/25/22 1447

## 2022-07-25 NOTE — ED Notes (Signed)
Patient transported to CT 

## 2022-07-25 NOTE — ED Notes (Signed)
Dc instructions reviewed with patient. Patient voiced understanding. Dc with belongings.  °

## 2022-08-04 ENCOUNTER — Ambulatory Visit: Payer: Medicaid Other | Admitting: Surgical

## 2022-08-18 ENCOUNTER — Ambulatory Visit: Payer: Medicaid Other | Admitting: Surgical

## 2022-08-18 DIAGNOSIS — M25561 Pain in right knee: Secondary | ICD-10-CM | POA: Diagnosis not present

## 2022-08-19 ENCOUNTER — Encounter: Payer: Self-pay | Admitting: Surgical

## 2022-08-19 NOTE — Progress Notes (Signed)
Office Visit Note   Patient: Loretta Ellis           Date of Birth: June 13, 1985           MRN: [id number] Visit Date: 08/18/2022 Requested by: [facility] [address] [place],  [place] [address] PCP: [facility]  Subjective: Chief Complaint  Patient presents with   Right Knee - Follow-up    HPI: Loretta Ellis is a 37 y.o. female who presents to the office for MRI review. Patient denies any changes in symptoms.  Continues to complain mainly of diffuse anterior pain with crepitus in the knee.  She had cortisone injection by Dr. August Saucer that gave her 100% relief of her knee pain for about 1 week before symptoms recurred.  She is now back basically where she was prior to the injection.  She denies any groin pain.  No locking of the knee.  She has occasional numbness and radiating pain from the mid thigh to the top of her right foot that has been ongoing for the last 6 months but she really only notices the symptoms 1 time per week and this does not feel like it involves her typical knee pain according to her.  MRI results revealed: MR Knee Right w/o contrast  Result Date: 07/22/2022 CLINICAL DATA:  Acute on chronic right knee pain after injury playing flag football several weeks ago. No prior surgery. EXAM: MRI OF THE RIGHT KNEE WITHOUT CONTRAST TECHNIQUE: Multiplanar, multisequence MR imaging of the knee was performed. No intravenous contrast was administered. COMPARISON:  Right knee x-rays dated July 05, 2022. MRI right knee dated June 25, 2021. FINDINGS: MENISCI Medial meniscus:  Intact. Lateral meniscus:  Intact. LIGAMENTS Cruciates:  Intact ACL and PCL. Collaterals: Medial collateral ligament is intact. Lateral collateral ligament complex is intact. CARTILAGE Patellofemoral: Unchanged partial-thickness cartilage loss over the medial patellar facet. Medial:  No chondral defect. Lateral:  No chondral defect. Joint:  Trace joint effusion.  Normal Hoffa's  fat. Popliteal Fossa: Tiny Baker cyst which may have recently ruptured given fluid overlying the posterior aspect of the medial gastrocnemius muscle. Intact popliteus tendon. Extensor Mechanism: Intact quadriceps tendon and patellar tendon. Intact medial and lateral patellar retinaculum. Intact MPFL. Bones: No focal marrow signal abnormality. No fracture or dislocation. Other: None. IMPRESSION: 1. No evidence of internal derangement. 2. Unchanged mild partial-thickness cartilage loss over the medial patellar facet. 3. Probable recently ruptured Baker cyst. Electronically Signed   By: Obie Dredge M.D.   On: 07/22/2022 15:05                 ROS: All systems reviewed are negative as they relate to the chief complaint within the history of present illness.  Patient denies fevers or chills.  Assessment & Plan: Visit Diagnoses:  1. Right knee pain, unspecified chronicity     Plan: Loretta Ellis is a 37 y.o. female who presents to the office for review of right knee MRI.  MRI of the right knee demonstrates no evidence of meniscal pathology really no significant change since prior MRI about a year ago.  She did have injection by Dr. August Saucer at her last appointment that gave her 100% relief of her knee pain though this only lasted for 1 week.  With no operative pathology noted on MRI scan, we discussed the options available to patient which mainly include living with her symptoms versus physical therapy versus diagnostic arthroscopy.  We discussed the concept of diagnostic arthroscopy  and the low likelihood of finding any pathology.  She would like to proceed with physical therapy upstairs for the right knee 1 time per week for the next 6 to 8 weeks.  Follow-up with Dr. August Saucer in 2 months for clinical recheck.  Follow-Up Instructions: No follow-ups on file.   Orders:  Orders Placed This Encounter  Procedures   Ambulatory referral to Physical Therapy   No orders of the defined types were placed in this  encounter.     Procedures: No procedures performed   Clinical Data: No additional findings.  Objective: Vital Signs: LMP 07/18/2022   Physical Exam:  Constitutional: Patient appears well-developed HEENT:  Head: Normocephalic Eyes:EOM are normal Neck: Normal range of motion Cardiovascular: Normal rate Pulmonary/chest: Effort normal Neurologic: Patient is alert Skin: Skin is warm Psychiatric: Patient has normal mood and affect  Ortho Exam: Ortho exam demonstrates right knee with trace effusion.  Minimal tenderness over the anterior medial joint line with no tenderness over the lateral joint line.  She has no calf tenderness.  Negative Homans' sign.  Excellent quad strength rated 5/5 with no extensor lag when performing straight leg raise.  She has range of motion from 0 degrees extension to greater than 120 degrees of knee flexion.  No pain with hip range of motion.  Excellent hip flexion, quadricep, hamstring, dorsiflexion, plantarflexion strength.  Stable to varus and valgus stress at 0 and 30 degrees with no difference compared to contralateral side.  Stable to anterior and posterior drawer sign.  Specialty Comments:  No specialty comments available.  Imaging: No results found.   PMFS History: Patient Active Problem List   Diagnosis Date Noted   Sprain of posterior cruciate ligament of right knee 06/08/2021   Normal labor 02/10/2016   Past Medical History:  Diagnosis Date   Anemia    Chlamydia    Gonorrhea    Heart murmur    No pertinent past medical history    Pregnancy induced hypertension     Family History  Problem Relation Age of Onset   Diabetes Mother    Hyperlipidemia Mother    Hypertension Mother    Stroke Mother    Diabetes Father    Hyperlipidemia Father    Hypertension Father    Heart disease Maternal Grandfather    Anesthesia problems Neg Hx    Hypotension Neg Hx    Malignant hyperthermia Neg Hx    Pseudochol deficiency Neg Hx     Past  Surgical History:  Procedure Laterality Date   ADENOIDECTOMY     EAR TUBE REMOVAL     TONSILLECTOMY     Social History   Occupational History   Not on file  Tobacco Use   Smoking status: Never   Smokeless tobacco: Never  Substance and Sexual Activity   Alcohol use: No   Drug use: No   Sexual activity: Yes    Birth control/protection: None

## 2022-08-23 ENCOUNTER — Encounter: Payer: Self-pay | Admitting: *Deleted

## 2022-09-08 ENCOUNTER — Ambulatory Visit: Payer: Medicaid Other

## 2022-09-23 ENCOUNTER — Other Ambulatory Visit: Payer: Self-pay | Admitting: Physician Assistant

## 2022-09-23 DIAGNOSIS — R1310 Dysphagia, unspecified: Secondary | ICD-10-CM

## 2022-10-01 ENCOUNTER — Ambulatory Visit
Admission: RE | Admit: 2022-10-01 | Discharge: 2022-10-01 | Disposition: A | Discharge status: Home / Self Care | Payer: Medicaid Other | Source: Ambulatory Visit | Attending: Physician Assistant | Admitting: Physician Assistant

## 2022-10-01 DIAGNOSIS — R1310 Dysphagia, unspecified: Secondary | ICD-10-CM

## 2022-10-18 ENCOUNTER — Ambulatory Visit: Payer: Medicaid Other | Admitting: Surgical

## 2022-11-05 ENCOUNTER — Ambulatory Visit (INDEPENDENT_AMBULATORY_CARE_PROVIDER_SITE_OTHER): Payer: Medicaid Other

## 2022-11-05 ENCOUNTER — Ambulatory Visit (INDEPENDENT_AMBULATORY_CARE_PROVIDER_SITE_OTHER): Payer: Medicaid Other | Admitting: Orthopaedic Surgery

## 2022-11-05 DIAGNOSIS — M25532 Pain in left wrist: Secondary | ICD-10-CM | POA: Diagnosis not present

## 2022-11-05 DIAGNOSIS — M778 Other enthesopathies, not elsewhere classified: Secondary | ICD-10-CM

## 2022-11-05 DIAGNOSIS — M5441 Lumbago with sciatica, right side: Secondary | ICD-10-CM

## 2022-11-05 DIAGNOSIS — G8929 Other chronic pain: Secondary | ICD-10-CM

## 2022-11-05 NOTE — Progress Notes (Signed)
Chief Complaint: Right knee, left wrist pain     History of Present Illness:    Loretta Ellis is a 37 y.o. female presents today with ongoing right knee pain as well as left wrist pain.  She has been seeing Franky Macho who injected the right knee in April 2024.  She got no relief from this.  She is experiencing buckling.  She did previously have physical therapy without any changes.  She importantly is being active at the gym.  She also is experiencing left ulnar base wrist pain as well.  She works as a Interior and spatial designer.    Surgical History:   None  PMH/PSH/Family History/Social History/Meds/Allergies:    Past Medical History:  Diagnosis Date  . Anemia   . Chlamydia   . Gonorrhea   . Heart murmur   . No pertinent past medical history   . Pregnancy induced hypertension    Past Surgical History:  Procedure Laterality Date  . ADENOIDECTOMY    . EAR TUBE REMOVAL    . TONSILLECTOMY     Social History   Socioeconomic History  . Marital status: Single    Spouse name: Not on file  . Number of children: Not on file  . Years of education: Not on file  . Highest education level: Not on file  Occupational History  . Not on file  Tobacco Use  . Smoking status: Never  . Smokeless tobacco: Never  Substance and Sexual Activity  . Alcohol use: No  . Drug use: No  . Sexual activity: Yes    Birth control/protection: None  Other Topics Concern  . Not on file  Social History Narrative   ** Merged History Encounter **       Social Determinants of Health   Financial Resource Strain: Not on file  Food Insecurity: Not on file  Transportation Needs: Not on file  Physical Activity: Not on file  Stress: Not on file  Social Connections: Not on file   Family History  Problem Relation Age of Onset  . Diabetes Mother   . Hyperlipidemia Mother   . Hypertension Mother   . Stroke Mother   . Diabetes Father   . Hyperlipidemia Father   . Hypertension  Father   . Heart disease Maternal Grandfather   . Anesthesia problems Neg Hx   . Hypotension Neg Hx   . Malignant hyperthermia Neg Hx   . Pseudochol deficiency Neg Hx    No Known Allergies Current Outpatient Medications  Medication Sig Dispense Refill  . cyclobenzaprine (FLEXERIL) 10 MG tablet Take 1 tablet (10 mg total) by mouth 3 (three) times daily as needed for muscle spasms. 10 tablet 0  . HYDROcodone-acetaminophen (NORCO/VICODIN) 5-325 MG tablet Take 1 tablet by mouth every 8 (eight) hours as needed. (Patient not taking: Reported on 07/16/2021) 15 tablet 0  . ibuprofen (ADVIL,MOTRIN) 600 MG tablet Take 1 tablet (600 mg total) by mouth every 6 (six) hours as needed. (Patient not taking: Reported on 01/05/2019) 30 tablet 1  . predniSONE (DELTASONE) 50 MG tablet Take 1 tablet (50 mg total) by mouth daily with breakfast. (Patient not taking: Reported on 07/16/2021) 5 tablet 0   No current facility-administered medications for this visit.   No results found.  Review of Systems:   A ROS was performed including pertinent  positives and negatives as documented in the HPI.  Physical Exam :   Constitutional: NAD and appears stated age Neurological: Alert and oriented Psych: Appropriate affect and cooperative There were no vitals taken for this visit.   Comprehensive Musculoskeletal Exam:    Tenderness palpation about the left wrist ECU tendon.  This is worse with resisted extension.  Otherwise full composite fist.  Tenderness to palpation about the patellofemoral joint.  No patellar maltracking.  She has range of motion from 0 to 130 degrees.  Negative J sign.  No laxity with 2 quadrants of medial lateral patellar motion  Imaging:   Xray (4 views right knee, 4 views lumbar spine, 3 views left wrist): Normal    I personally reviewed and interpreted the radiographs.   Assessment:   37 y.o. female with left wrist pain consistent with ECU tendinitis.  I recommended ultrasound-guided  injection of the ECU tendon which she would like to proceed today.  We did also discuss bracing although this would be quite difficult with her job as a Interior and spatial designer.  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  Given this I did give her a home program which she can work through.  I will also plan to recommend and refer her for 1 or 2 therapy sessions that she can work on a good hip and core strengthening for the right knee.  I will plan to see her back as needed  Plan :    -Left wrist ultrasound-guided injection provided after verbal consent obtained   Procedure Note  Patient: Loretta Ellis             Date of Birth: 05/21/1985           MRN: [id number]             Visit Date: 11/05/2022  Procedures: Visit Diagnoses:  1. Chronic right-sided low back pain with right-sided sciatica   2. Pain in left wrist     Medium Joint Inj on 11/05/2022 3:07 PM Details: ultrasound-guided lateral approach        I personally saw and evaluated the patient, and participated in the management and treatment plan.  Huel Cote, MD Attending Physician, Orthopedic Surgery  This document was dictated using Dragon voice recognition software. A reasonable attempt at proof reading has been made to minimize errors.

## 2022-12-02 ENCOUNTER — Encounter: Payer: Self-pay | Admitting: Nurse Practitioner

## 2022-12-09 ENCOUNTER — Telehealth: Payer: Self-pay

## 2022-12-09 ENCOUNTER — Other Ambulatory Visit (INDEPENDENT_AMBULATORY_CARE_PROVIDER_SITE_OTHER): Payer: Medicaid Other

## 2022-12-09 ENCOUNTER — Encounter: Payer: Self-pay | Admitting: Nurse Practitioner

## 2022-12-09 ENCOUNTER — Ambulatory Visit: Payer: Medicaid Other | Admitting: Nurse Practitioner

## 2022-12-09 VITALS — BP 118/72 | HR 95 | Ht 69.0 in | Wt 222.2 lb

## 2022-12-09 DIAGNOSIS — R933 Abnormal findings on diagnostic imaging of other parts of digestive tract: Secondary | ICD-10-CM

## 2022-12-09 DIAGNOSIS — D509 Iron deficiency anemia, unspecified: Secondary | ICD-10-CM

## 2022-12-09 DIAGNOSIS — D500 Iron deficiency anemia secondary to blood loss (chronic): Secondary | ICD-10-CM | POA: Diagnosis not present

## 2022-12-09 DIAGNOSIS — R1310 Dysphagia, unspecified: Secondary | ICD-10-CM

## 2022-12-09 LAB — IBC + FERRITIN
Ferritin: 7.2 ng/mL — ABNORMAL LOW (ref 10.0–291.0)
Iron: 46 ug/dL (ref 42–145)
Saturation Ratios: 9.3 % — ABNORMAL LOW (ref 20.0–50.0)
TIBC: 497 ug/dL — ABNORMAL HIGH (ref 250.0–450.0)
Transferrin: 355 mg/dL (ref 212.0–360.0)

## 2022-12-09 LAB — CBC
HCT: 37.5 % (ref 36.0–46.0)
Hemoglobin: 11.9 g/dL — ABNORMAL LOW (ref 12.0–15.0)
MCHC: 31.7 g/dL (ref 30.0–36.0)
MCV: 84.5 fl (ref 78.0–100.0)
Platelets: 423 10*3/uL — ABNORMAL HIGH (ref 150.0–400.0)
RBC: 4.43 Mil/uL (ref 3.87–5.11)
RDW: 15.5 % (ref 11.5–15.5)
WBC: 7.3 10*3/uL (ref 4.0–10.5)

## 2022-12-09 NOTE — Patient Instructions (Addendum)
Your provider has requested that you go to the basement level for lab work before leaving today. Press "B" on the elevator. The lab is located at the first door on the left as you exit the elevator.  You have been scheduled for an endoscopy. Please follow written instructions given to you at your visit today.  If you use inhalers (even only as needed), please bring them with you on the day of your procedure.  If you take any of the following medications, they will need to be adjusted prior to your procedure:   DO NOT TAKE 7 DAYS PRIOR TO TEST- Trulicity (dulaglutide) Ozempic, Wegovy (semaglutide) Mounjaro (tirzepatide) Bydureon Bcise (exanatide extended release)  DO NOT TAKE 1 DAY PRIOR TO YOUR TEST Rybelsus (semaglutide) Adlyxin (lixisenatide) Victoza (liraglutide) Byetta (exanatide)   If your blood pressure at your visit was 140/90 or greater, please contact your primary care physician to follow up on this.  _______________________________________________________  If you are age 35 or older, your body mass index should be between 23-30. Your Body mass index is 32.82 kg/m. If this is out of the aforementioned range listed, please consider follow up with your Primary Care Provider.  If you are age 14 or younger, your body mass index should be between 19-25. Your Body mass index is 32.82 kg/m. If this is out of the aformentioned range listed, please consider follow up with your Primary Care Provider.   ________________________________________________________  The [facility] providers would like to encourage you to use [facility] to communicate with providers for non-urgent requests or questions.  Due to long hold times on the telephone, sending your provider a message by [facility] may be a faster and more efficient way to get a response.  Please allow 48 business hours for a response.  Please remember that this is for non-urgent requests.   _______________________________________________________  Due to recent changes in healthcare laws, you may see the results of your imaging and laboratory studies on MyChart before your provider has had a chance to review them.  We understand that in some cases there may be results that are confusing or concerning to you. Not all laboratory results come back in the same time frame and the provider may be waiting for multiple results in order to interpret others.  Please give [place] 48 hours in order for your provider to thoroughly review all the results before contacting the office for clarification of your results.   Thank you for entrusting me with your care and choosing [facility].  Willette Cluster NP

## 2022-12-09 NOTE — Telephone Encounter (Signed)
-----   Message from Willette Cluster sent at 12/09/2022  5:02 PM EDT ----- Loretta Ellis, please let this patient know that she is iron deficient.  Please ask her to start ferrous sulfate 325 mg daily.  Previously this constipate her so she should take 2 stool softeners at bedtime and if that is not enough then can take MiraLAX as needed.  Please send a copy of this report and my comments to her PCP.  Her counts will need to be monitored on iron.  Iron deficiency is most likely secondary to heavy menses.  Thanks

## 2022-12-09 NOTE — Progress Notes (Signed)
Noted  

## 2022-12-09 NOTE — Progress Notes (Signed)
Primary GI: Yancey Flemings, MD    ASSESSMENT & PLAN   37 y.o. yo female with a past medical history consisting of, but not necessarily limited to possible epiploic appendagitis and obesity. Referred by Aquilla Hacker, PA -C ([facility] )  Progressive dysphagia to both solids and liquids with abnormal barium swallow showing non-specific dysmotility and suspension of barium tablet at GE junction.  -Schedule for EGD with Dr. Marina Goodell ( has opening next week). The risks and benefits of EGD with possible biopsies were discussed with the patient who agrees to proceed.  -Swallowing precautions discussed. Advised to eat slowly, chew food well before swallowing.    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  - check iron studies . If iron deficient she will need iron and have blood counts monitored by PCP. She is followed by GYN. Years ago oral iron caused constipation so will if iron is necessary she will need concurrent stool softeners and/or MiraLAX   HPI   Cc: Difficulty swallowing solids and liquids, problem getting worse  Loretta Ellis has had difficulty swallowing solids and liquids for many years but this has gotten progressively worse over the last several months. Her brother has a history of similar problem and had to get esophagus stretched.  She saw ENT for sinus problems and diagnosed with allergic rhinitis. She mentioned the swallowing issues and was sent for a barium swallow which showed non-specific dysmotility and holding up of barium tablet at GE junction.  ( see reports below). Eating puts her in embarrassing situations. She often has to induce vomiting to get food out.  No GERD symptoms.   May 2024 Barium Swallow:  IMPRESSION: Nonspecific esophageal dysmotility, with no hiatal hernia, stricture/obstruction. The barium tablet was indefinitely suspended at the GE junction, and did not pass with extra time, additional water, additional heavy barium  ingestion   No lower Gi complaints. BMs normal. No blood in stool. No FMH of colon cancer.     Labs      Latest Ref Rng & Units 07/25/2022   12:18 PM 02/13/2020    6:38 PM 01/05/2019   11:58 AM  CBC  WBC 4.0 - 10.5 K/uL 5.9  7.8  7.7   Hemoglobin 12.0 - 15.0 g/dL 62.1  30.8  65.7   Hematocrit 36.0 - 46.0 % 36.5  35.2  35.5   Platelets 150 - 400 K/uL 410  334  398     Lab Results  Component Value Date   LIPASE 22 07/25/2022      Latest Ref Rng & Units 07/25/2022   12:18 PM 02/13/2020    6:38 PM 01/05/2019   11:58 AM  CMP  Glucose 70 - 99 mg/dL 96  94  93   BUN 6 - 20 mg/dL 11  9  12    Creatinine 0.44 - 1.00 mg/dL 8.46  9.62  9.52   Sodium 135 - 145 mmol/L 138  137  139   Potassium 3.5 - 5.1 mmol/L 4.1  3.9  3.9   Chloride 98 - 111 mmol/L 106  106  109   CO2 22 - 32 mmol/L 25  24  23    Calcium 8.9 - 10.3 mg/dL 9.6  9.2  9.1   Total Protein 6.5 - 8.1 g/dL 7.6     Total Bilirubin 0.3 - 1.2 mg/dL 0.3     Alkaline Phos 38 - 126 U/L 80     AST 15 - 41 U/L 13  ALT 0 - 44 U/L 9       Past Medical History:  Diagnosis Date   Anemia    Chlamydia    Gonorrhea    Heart murmur    No pertinent past medical history    Pregnancy induced hypertension    Past Surgical History:  Procedure Laterality Date   ADENOIDECTOMY     EAR TUBE REMOVAL     TONSILLECTOMY     Family History  Problem Relation Age of Onset   Diabetes Mother    Hyperlipidemia Mother    Hypertension Mother    Stroke Mother    Diabetes Father    Hyperlipidemia Father    Hypertension Father    Heart disease Maternal Grandfather    Anesthesia problems Neg Hx    Hypotension Neg Hx    Malignant hyperthermia Neg Hx    Pseudochol deficiency Neg Hx    Social History   Tobacco Use   Smoking status: Never   Smokeless tobacco: Never  Substance Use Topics   Alcohol use: No   Drug use: No   Current Outpatient Medications  Medication Sig Dispense Refill   hydrochlorothiazide (HYDRODIURIL) 25 MG tablet  Take 25 mg by mouth daily.     ibuprofen (ADVIL,MOTRIN) 600 MG tablet Take 1 tablet (600 mg total) by mouth every 6 (six) hours as needed. 30 tablet 1   No current facility-administered medications for this visit.   No Known Allergies   Review of Systems: Positive for allergy, sinus trouble, back pain, heartburn, menstrual pain, muscle cramps.  All other systems reviewed and negative except where noted in HPI.   Wt Readings from Last 3 Encounters:  07/25/22 217 lb (98.4 kg)  06/08/21 205 lb (93 kg)  06/07/21 205 lb (93 kg)    Physical Exam:  BP 118/72   Pulse 95   Ht 5\' 9"  (1.753 m)   Wt 222 lb 4 oz (100.8 kg)   SpO2 99%   BMI 32.82 kg/m  Constitutional:  Pleasant, female in no acute distress. Psychiatric:  Normal mood and affect. Behavior is normal. EENT: Pupils normal.  Conjunctivae are normal. No scleral icterus. Neck supple.  Cardiovascular: Normal rate, regular rhythm.  Pulmonary/chest: Effort normal and breath sounds normal. No wheezing, rales or rhonchi. Abdominal: Soft, nondistended, nontender. Bowel sounds active throughout. There are no masses palpable. No hepatomegaly. Neurological: Alert and oriented to person place and time. Skin: Skin is warm and dry. No rashes noted.  Willette Cluster, NP  12/09/2022, 1:28 PM

## 2022-12-10 ENCOUNTER — Ambulatory Visit (HOSPITAL_BASED_OUTPATIENT_CLINIC_OR_DEPARTMENT_OTHER): Payer: Medicaid Other | Attending: Orthopaedic Surgery | Admitting: Physical Therapy

## 2022-12-10 NOTE — Telephone Encounter (Signed)
Left message for pt to call back  °

## 2022-12-13 ENCOUNTER — Ambulatory Visit (AMBULATORY_SURGERY_CENTER): Payer: Medicaid Other | Admitting: Internal Medicine

## 2022-12-13 ENCOUNTER — Encounter: Payer: Self-pay | Admitting: Internal Medicine

## 2022-12-13 VITALS — BP 127/84 | HR 50 | Temp 97.5°F | Resp 17 | Ht 69.0 in | Wt 222.0 lb

## 2022-12-13 DIAGNOSIS — K222 Esophageal obstruction: Secondary | ICD-10-CM | POA: Diagnosis not present

## 2022-12-13 DIAGNOSIS — R1310 Dysphagia, unspecified: Secondary | ICD-10-CM

## 2022-12-13 MED ORDER — SODIUM CHLORIDE 0.9 % IV SOLN: 500.0000 mL | INTRAVENOUS | Status: DC

## 2022-12-13 MED ORDER — PANTOPRAZOLE SODIUM 40 MG PO TBEC
40.0000 mg | DELAYED_RELEASE_TABLET | Freq: Every day | ORAL | 3 refills | Status: DC
Start: 2022-12-13 — End: 2024-01-14

## 2022-12-13 NOTE — Progress Notes (Signed)
Expand All Collapse All    Primary GI: Yancey Flemings, MD      ASSESSMENT & PLAN    37 y.o. yo female with a past medical history consisting of, but not necessarily limited to possible epiploic appendagitis and obesity. Referred by Aquilla Hacker, PA -C ([facility] )   Progressive dysphagia to both solids and liquids with abnormal barium swallow showing non-specific dysmotility and suspension of barium tablet at GE junction.  -Schedule for EGD with Dr. Marina Goodell ( has opening next week). The risks and benefits of EGD with possible biopsies were discussed with the patient who agrees to proceed.  -Swallowing precautions discussed. Advised to eat slowly, chew food well before swallowing.      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  - check iron studies . If iron deficient she will need iron and have blood counts monitored by PCP. She is followed by GYN. Years ago oral iron caused constipation so will if iron is necessary she will need concurrent stool softeners and/or MiraLAX     HPI    Cc: Difficulty swallowing solids and liquids, problem getting worse   Loretta Ellis has had difficulty swallowing solids and liquids for many years but this has gotten progressively worse over the last several months. Her brother has a history of similar problem and had to get esophagus stretched.  She saw ENT for sinus problems and diagnosed with allergic rhinitis. She mentioned the swallowing issues and was sent for a barium swallow which showed non-specific dysmotility and holding up of barium tablet at GE junction.  ( see reports below). Eating puts her in embarrassing situations. She often has to induce vomiting to get food out.  No GERD symptoms.    May 2024 Barium Swallow:  IMPRESSION: Nonspecific esophageal dysmotility, with no hiatal hernia, stricture/obstruction. The barium tablet was indefinitely suspended at the GE junction, and did not pass with extra time,  additional water, additional heavy barium ingestion     No lower Gi complaints. BMs normal. No blood in stool. No FMH of colon cancer.       Labs        Latest Ref Rng & Units 07/25/2022   12:18 PM 02/13/2020    6:38 PM 01/05/2019   11:58 AM  CBC  WBC 4.0 - 10.5 K/uL 5.9  7.8  7.7   Hemoglobin 12.0 - 15.0 g/dL 65.7  84.6  96.2   Hematocrit 36.0 - 46.0 % 36.5  35.2  35.5   Platelets 150 - 400 K/uL 410  334  398       Recent Labs       Lab Results  Component Value Date    LIPASE 22 07/25/2022          Latest Ref Rng & Units 07/25/2022   12:18 PM 02/13/2020    6:38 PM 01/05/2019   11:58 AM  CMP  Glucose 70 - 99 mg/dL 96  94  93   BUN 6 - 20 mg/dL 11  9  12    Creatinine 0.44 - 1.00 mg/dL 9.52  8.41  3.24   Sodium 135 - 145 mmol/L 138  137  139   Potassium 3.5 - 5.1 mmol/L 4.1  3.9  3.9   Chloride 98 - 111 mmol/L 106  106  109   CO2 22 - 32 mmol/L 25  24  23    Calcium 8.9 - 10.3 mg/dL 9.6  9.2  9.1   Total  Protein 6.5 - 8.1 g/dL 7.6       Total Bilirubin 0.3 - 1.2 mg/dL 0.3       Alkaline Phos 38 - 126 U/L 80       AST 15 - 41 U/L 13       ALT 0 - 44 U/L 9               Past Medical History:  Diagnosis Date   Anemia     Chlamydia     Gonorrhea     Heart murmur     No pertinent past medical history     Pregnancy induced hypertension               Past Surgical History:  Procedure Laterality Date   ADENOIDECTOMY       EAR TUBE REMOVAL       TONSILLECTOMY                 Family History  Problem Relation Age of Onset   Diabetes Mother     Hyperlipidemia Mother     Hypertension Mother     Stroke Mother     Diabetes Father     Hyperlipidemia Father     Hypertension Father     Heart disease Maternal Grandfather     Anesthesia problems Neg Hx     Hypotension Neg Hx     Malignant hyperthermia Neg Hx     Pseudochol deficiency Neg Hx          Social History  Social History        Tobacco Use   Smoking status: Never   Smokeless tobacco: Never   Substance Use Topics   Alcohol use: No   Drug use: No            Current Outpatient Medications  Medication Sig Dispense Refill   hydrochlorothiazide (HYDRODIURIL) 25 MG tablet Take 25 mg by mouth daily.       ibuprofen (ADVIL,MOTRIN) 600 MG tablet Take 1 tablet (600 mg total) by mouth every 6 (six) hours as needed. 30 tablet 1      No current facility-administered medications for this visit.      Allergies  No Known Allergies       Review of Systems: Positive for allergy, sinus trouble, back pain, heartburn, menstrual pain, muscle cramps.  All other systems reviewed and negative except where noted in HPI.       Wt Readings from Last 3 Encounters:  07/25/22 217 lb (98.4 kg)  06/08/21 205 lb (93 kg)  06/07/21 205 lb (93 kg)      Physical Exam:  BP 118/72   Pulse 95   Ht 5\' 9"  (1.753 m)   Wt 222 lb 4 oz (100.8 kg)   SpO2 99%   BMI 32.82 kg/m  Constitutional:  Pleasant, female in no acute distress. Psychiatric:  Normal mood and affect. Behavior is normal. EENT: Pupils normal.  Conjunctivae are normal. No scleral icterus. Neck supple.  Cardiovascular: Normal rate, regular rhythm.  Pulmonary/chest: Effort normal and breath sounds normal. No wheezing, rales or rhonchi. Abdominal: Soft, nondistended, nontender. Bowel sounds active throughout. There are no masses palpable. No hepatomegaly. Neurological: Alert and oriented to person place and time. Skin: Skin is warm and dry. No rashes noted.   Willette Cluster, NP  12/09/2022, 1:28 PM

## 2022-12-13 NOTE — Progress Notes (Signed)
Report to PACU, RN, vss, BBS= Clear.  

## 2022-12-13 NOTE — Progress Notes (Signed)
Pt's states no medical or surgical changes since previsit or office visit. 

## 2022-12-13 NOTE — Patient Instructions (Addendum)
A prescription for Pantoprazole 40mg  daily has been sent to your pharmacy.  Please follow the post-dilation diet, handout/instructions provided.  Please follow up with Dr. Marina Goodell in 6 weeks in his office (his nurse will call you to make this appointment)  YOU HAD AN ENDOSCOPIC PROCEDURE TODAY AT THE [facility]:   Refer to the procedure report that was given to you for any specific questions about what was found during the examination.  If the procedure report does not answer your questions, please call your gastroenterologist to clarify.  If you requested that your care partner not be given the details of your procedure findings, then the procedure report has been included in a sealed envelope for you to review at your convenience later.  YOU SHOULD EXPECT: Some feelings of bloating in the abdomen. Passage of more gas than usual.  Walking can help get rid of the air that was put into your GI tract during the procedure and reduce the bloating. If you had a lower endoscopy (such as a colonoscopy or flexible sigmoidoscopy) you may notice spotting of blood in your stool or on the toilet paper. If you underwent a bowel prep for your procedure, you may not have a normal bowel movement for a few days.  Please Note:  You might notice some irritation and congestion in your nose or some drainage.  This is from the oxygen used during your procedure.  There is no need for concern and it should clear up in a day or so.  SYMPTOMS TO REPORT IMMEDIATELY:   Following upper endoscopy (EGD)  Vomiting of blood or coffee ground material  New chest pain or pain under the shoulder blades  Painful or persistently difficult swallowing  New shortness of breath  Fever of 100F or higher  Black, tarry-looking stools  For urgent or emergent issues, a gastroenterologist can be reached at any hour by calling [phone]. Do not use MyChart messaging for urgent concerns.    DIET:  Post dilation diet,  handout provided.  Drink plenty of fluids but you should avoid alcoholic beverages for 24 hours.  ACTIVITY:  You should plan to take it easy for the rest of today and you should NOT DRIVE or use heavy machinery until tomorrow (because of the sedation medicines used during the test).    FOLLOW UP: Our staff will call the number listed on your records the next business day following your procedure.  We will call around 7:15- 8:00 am to check on you and address any questions or concerns that you may have regarding the information given to you following your procedure. If we do not reach you, we will leave a message.     If any biopsies were taken you will be contacted by phone or by letter within the next 1-3 weeks.  Please call us at [phone] if you have not heard about the biopsies in 3 weeks.    SIGNATURES/CONFIDENTIALITY: You and/or your care partner have signed paperwork which will be entered into your electronic medical record.  These signatures attest to the fact that that the information above on your After Visit Summary has been reviewed and is understood.  Full responsibility of the confidentiality of this discharge information lies with you and/or your care-partner.

## 2022-12-13 NOTE — Telephone Encounter (Signed)
Patient advised of the recommendations. Agrees to this plan of care.

## 2022-12-13 NOTE — Op Note (Signed)
[facility] Patient Name: Loretta Ellis Procedure Date: 12/13/2022 10:04 AM MRN: [id number] Endoscopist: Wilhemina Bonito. Marina Goodell , MD, [id number] Age: 37 Referring MD:  Date of Birth: 1985-12-17 Gender: Female Account #: [id number] Procedure:                Upper GI endoscopy with balloon dilation of the                            esophagus. 18 mm max Indications:              Dysphagia, Therapeutic procedure Medicines:                Monitored Anesthesia Care Procedure:                Pre-Anesthesia Assessment:                           - Prior to the procedure, a History and Physical                            was performed, and patient medications and                            allergies were reviewed. The patient's tolerance of                            previous anesthesia was also reviewed. The risks                            and benefits of the procedure and the sedation                            options and risks were discussed with the patient.                            All questions were answered, and informed consent                            was obtained. Prior Anticoagulants: The patient has                            taken no anticoagulant or antiplatelet agents. ASA                            Grade Assessment: II - A patient with mild systemic                            disease. After reviewing the risks and benefits,                            the patient was deemed in satisfactory condition to                            undergo the procedure.  After obtaining informed consent, the endoscope was                            passed under direct vision. Throughout the                            procedure, the patient's blood pressure, pulse, and                            oxygen saturations were monitored continuously. The                            Olympus scope [id number] was introduced through the                            mouth, and advanced  to the second part of duodenum.                            The upper GI endoscopy was accomplished without                            difficulty. The patient tolerated the procedure                            well. Scope In: Scope Out: Findings:                 One benign-appearing, ringlike, intrinsic moderate                            stenosis was found 40 cm from the incisors. This                            stenosis measured 1.4 cm (inner diameter). A TTS                            dilator was passed through the scope. Dilation with                            an 18-19-20 mm balloon dilator was performed to 18                            mm. There was mucosal disruption of the ring.                           The exam of the esophagus was otherwise normal.                           The stomach was normal. Small hiatal hernia.                           The examined duodenum was normal.  The cardia and gastric fundus were normal on                            retroflexion. Complications:            No immediate complications. Estimated Blood Loss:     Estimated blood loss: none. Impression:               - Benign-appearing esophageal stenosis. Dilated.                           - Normal stomach. Small hiatal hernia.                           - Normal examined duodenum.                           - No specimens collected. Recommendation:           1. Patient has a contact number available for                            emergencies. The signs and symptoms of potential                            delayed complications were discussed with the                            patient. Return to normal activities tomorrow.                            Written discharge instructions were provided to the                            patient.                           2. Post dilation diet.                           3. Continue present medications.                           4.  PLEASE PRESCRIBE PANTOPRAZOLE 40 mg daily; #30;                            3 refills                           5. Office follow-up with Dr. Marina Goodell in 6 weeks Wilhemina Bonito. Marina Goodell, MD 12/13/2022 10:22:48 AM This report has been signed electronically.

## 2022-12-13 NOTE — Progress Notes (Signed)
Called to room to assist during endoscopic procedure.  Patient ID and intended procedure confirmed with present staff. Received instructions for my participation in the procedure from the performing physician.  

## 2022-12-14 ENCOUNTER — Telehealth: Payer: Self-pay

## 2022-12-14 NOTE — Telephone Encounter (Signed)
Follow up call placed, no answer and no VM. 

## 2023-03-01 ENCOUNTER — Ambulatory Visit: Payer: Medicaid Other | Admitting: Family Medicine

## 2023-03-11 ENCOUNTER — Ambulatory Visit: Payer: Medicaid Other | Admitting: Family Medicine

## 2023-04-26 ENCOUNTER — Ambulatory Visit: Payer: Medicaid Other | Admitting: Internal Medicine

## 2023-08-17 ENCOUNTER — Ambulatory Visit (INDEPENDENT_AMBULATORY_CARE_PROVIDER_SITE_OTHER): Admitting: Sports Medicine

## 2023-08-17 ENCOUNTER — Other Ambulatory Visit (INDEPENDENT_AMBULATORY_CARE_PROVIDER_SITE_OTHER): Payer: Self-pay

## 2023-08-17 DIAGNOSIS — M5030 Other cervical disc degeneration, unspecified cervical region: Secondary | ICD-10-CM | POA: Diagnosis not present

## 2023-08-17 DIAGNOSIS — M542 Cervicalgia: Secondary | ICD-10-CM

## 2023-08-17 DIAGNOSIS — N3946 Mixed incontinence: Secondary | ICD-10-CM | POA: Diagnosis not present

## 2023-08-17 DIAGNOSIS — G8929 Other chronic pain: Secondary | ICD-10-CM

## 2023-08-17 DIAGNOSIS — M5442 Lumbago with sciatica, left side: Secondary | ICD-10-CM

## 2023-08-17 DIAGNOSIS — M62838 Other muscle spasm: Secondary | ICD-10-CM

## 2023-08-17 MED ORDER — METHYLPREDNISOLONE 4 MG PO TBPK: ORAL_TABLET | ORAL | 0 refills | Status: DC

## 2023-08-17 NOTE — Progress Notes (Unsigned)
 Patient says that she has been having back pain since Sunday. 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 She has had low back pain in the past, and says that this starts a bit higher but does go down her back; she says her pain does not go up the back at all. She does have pain, numbness, and tingling in the left leg; this pain goes down the side of the leg and down to the foot. Both the pain in the back and the symptoms in the leg come and go. She says that she takes Ibuprofen as needed, as well as heat patches.

## 2023-08-17 NOTE — Progress Notes (Addendum)
 Loretta Ellis - 39 y.o. female MRN [id number]  Date of birth: 24-Dec-1985  Office Visit Note: Visit Date: 08/17/2023 PCP: Denette Finner, PA-C Referred by: Denette Finner, PA-C  Subjective: Chief Complaint  Patient presents with  . Lower Back - Pain   HPI: Loretta Ellis is a pleasant 38 y.o. female who presents today for evaluation of chronic low back pain with left radiculopathy as well as spine pain/neck pain.  Loretta Ellis works as a Scientist, research (medical) by trade.  She has been experiencing midline low back pain that will be quite bothersome and does affect her work.  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  She is getting intermittent numbness and tingling from the back that goes down the left leg all the way into the feet.  This is not present all the time, she feels like it is the whole leg, difficult to ascertain front, side or posterior leg.  She has a history of sciatica after the birth of her oldest child, who is now 40.  She denies any numbness or tingling on the right leg.  She also has pain in her neck and tightness in the musculature.  This worsens with the nature of her work.  She does report intermittent numbness and tingling in the hands and wrist, specifically with the more work she does styling hair.  This is not persistent or constant however.   She does alter between some urge incontinence and stress incontinence intermittently.  She has no dysphagia or difficulty swallowing.  She states she has poor vision, she is due for repeat prescription.  About 1 month ago she had issues with her right eye, has chronic blurry vision per patient.  At times her whole body will hurt, she does take ibuprofen  as needed with some relief.   Pertinent ROS were reviewed with the patient and found to be negative unless otherwise specified above in HPI.   Assessment & Plan: Visit Diagnoses:  1. Chronic midline low back pain with left-sided sciatica    2. Neck pain   3. DDD (degenerative disc disease), cervical   4. Trapezius muscle spasm   5. Mixed stress and urge urinary incontinence    Plan: Impression is acute on chronic low back pain with intermittent radicular symptoms down the left leg. Her x-rays do not show any notable DDD or instability. I would like to see her response to a 6-day Medrol  Dosepak, this was sent in today. In terms of her cervical spine and trapezius/paraspinal musculature, this is certainly symptomatic for her which I think is exacerbated from her profession as a Scientist, research (medical).  We will send a referral for chiropractic care for both of these conditions.  We had a discussion regarding the origin of her pain, I do think there is a large component of postural changes and muscle-related pain due to her prolonged standing and positioning as a hairstylist.  She does however have some nerve related findings as well as chronic vision issue and intermittent bladder incontinence.  There is certainly less likely, but cannot rule out autoimmune conditions such as MS. I would like to see how she responds to the prednisone  as well as chiropractic care and postural changes at work and she will let me know in about 4-6 weeks how she is doing for follow-up.  Additional workup considerations: MRI of the lumbar spine, MRI of brain and spinal cord, ? Neuro referral, prescription NSAID  Follow-up: Return for  f/u and/or update me on progress in next 4-6 weeks .   Meds & Orders:  Meds ordered this encounter  Medications  . methylPREDNISolone  (MEDROL  DOSEPAK) 4 MG TBPK tablet    Sig: Take per packet instruction. Taper dosing.    Dispense:  1 each    Refill:  0    Orders Placed This Encounter  Procedures  . XR Cervical Spine 2 or 3 views  . Ambulatory referral to Chiropractic     Procedures: No procedures performed      Clinical History: No specialty comments available.  She reports that she has never smoked. She has never used  smokeless tobacco. No results for input(s): "HGBA1C", "LABURIC" in the last 8760 hours.  Objective:    Physical Exam  Gen: Well-appearing, in no acute distress; non-toxic CV: Well-perfused. Warm.  Resp: Breathing unlabored on room air; no wheezing. Psych: Fluid speech in conversation; appropriate affect; normal thought process  Ortho Exam - Cervical: There is no midline spinous process TTP.  There is reciprocal paraspinal hypertonicity with notable trigger point and hypertonicity of the right greater than left trapezius muscle.  Bilateral shoulder protraction noted.  There is pain with endrange flexion and extension of the cervical spine.  Negative Spurling's test.  - Lumbar: There is tenderness to palpation near the L1-L2 level of the midline of the lumbar spine, she also has tenderness near the left SI joint region.  There is equivocal straight leg raise on the left.  There is no weakness with bilateral leg testing.   Imaging: XR Cervical Spine 2 or 3 views Complete view of the cervical spine including AP, lateral,  flexion/extension views were ordered and reviewed by myself today.  X-rays  demonstrate notable straightening of the normal lordotic curve.  There is  very early cervical DDD at the C5-C6 level.  No instability with  flexion/extension views.   *Personally reviewed lumbar XR's during visit today. Narrative & Impression  CLINICAL DATA:  Low back pain, no known injury, initial encounter   EXAM: LUMBAR SPINE - COMPLETE 4+ VIEW   COMPARISON:  05/29/2016   FINDINGS: Five lumbar type vertebral bodies are well visualized. Vertebral body height is well maintained. Flexion and extension views show no significant instability. No pars defects are noted. No soft tissue changes are noted.   IMPRESSION: No instability is identified.     Electronically Signed   By: Violeta Grey M.D.   On: 11/11/2022 23:51      Past Medical/Family/Surgical/Social History: Medications &  Allergies reviewed per EMR, new medications updated. Patient Active Problem List   Diagnosis Date Noted  . Sprain of posterior cruciate ligament of right knee 06/08/2021  . Normal labor 02/10/2016   Past Medical History:  Diagnosis Date  . Anemia   . Chlamydia   . Gonorrhea   . Heart murmur   . No pertinent past medical history   . Pregnancy induced hypertension    Family History  Problem Relation Age of Onset  . Diabetes Mother   . Hyperlipidemia Mother   . Hypertension Mother   . Stroke Mother   . Diabetes Father   . Hyperlipidemia Father   . Hypertension Father   . Esophageal cancer Maternal Aunt   . Heart disease Maternal Grandfather   . Stomach cancer Maternal Great-grandmother   . Anesthesia problems Neg Hx   . Hypotension Neg Hx   . Malignant hyperthermia Neg Hx   . Pseudochol deficiency Neg Hx   . Colon cancer  Neg Hx   . Rectal cancer Neg Hx   . Liver cancer Neg Hx   . Pancreatic cancer Neg Hx   . Colon polyps Neg Hx    Past Surgical History:  Procedure Laterality Date  . ADENOIDECTOMY    . EAR TUBE REMOVAL    . TONSILLECTOMY     Social History   Occupational History  . Not on file  Tobacco Use  . Smoking status: Never  . Smokeless tobacco: Never  Vaping Use  . Vaping status: Never Used  Substance and Sexual Activity  . Alcohol use: Yes    Comment: occ  . Drug use: No  . Sexual activity: Yes    Birth control/protection: None

## 2023-08-18 ENCOUNTER — Encounter: Payer: Self-pay | Admitting: Sports Medicine

## 2023-08-31 ENCOUNTER — Other Ambulatory Visit: Payer: Self-pay | Admitting: Sports Medicine

## 2023-08-31 ENCOUNTER — Encounter: Payer: Self-pay | Admitting: Sports Medicine

## 2023-08-31 DIAGNOSIS — N3946 Mixed incontinence: Secondary | ICD-10-CM

## 2023-08-31 DIAGNOSIS — M5030 Other cervical disc degeneration, unspecified cervical region: Secondary | ICD-10-CM

## 2023-08-31 DIAGNOSIS — H538 Other visual disturbances: Secondary | ICD-10-CM

## 2023-08-31 DIAGNOSIS — M5412 Radiculopathy, cervical region: Secondary | ICD-10-CM

## 2023-08-31 DIAGNOSIS — M542 Cervicalgia: Secondary | ICD-10-CM

## 2023-08-31 NOTE — Progress Notes (Signed)
 Patient continues with spine pain from neck to mid-low back. Most bothersome is neck pain with radicular symptoms into the upper shoulders and occasionally in hands and fingers. She also has persistent symptoms > 1 year of above including more worrisome associated symptoms of bladder incontinence (mixed stress and urge incontinence), blurry vision/poor vision, generalized muscle pain and stiffness, and other nerve-related pain (down b/l legs). Fatigue is also present with sleep being affected. These symptoms have been chronic and persistent even after trialing multiple anti-inflammatories, prednisone , OTC-medications as well as previous physical and home therapy.  Given this, I think it is pertinent to obtain both a cervical MRI to evaluate vertebrae, nerves, and spinal cord contents, as well as an MRI brain w and w/o contrast to rule demylelinating diseases (MS, etc.).  - Cervical MRI w/o contrast - Brain MRI w and w/o contrast ordered  Shauna Del, DO Primary Care Sports Medicine Physician  [facility] [place] - Orthopedics

## 2023-09-10 ENCOUNTER — Ambulatory Visit (HOSPITAL_COMMUNITY)
Admission: RE | Admit: 2023-09-10 | Discharge: 2023-09-10 | Disposition: A | Discharge status: Home / Self Care | Source: Ambulatory Visit | Attending: Sports Medicine | Admitting: Sports Medicine

## 2023-09-10 DIAGNOSIS — N3946 Mixed incontinence: Secondary | ICD-10-CM

## 2023-09-10 DIAGNOSIS — H538 Other visual disturbances: Secondary | ICD-10-CM

## 2023-09-10 DIAGNOSIS — M5412 Radiculopathy, cervical region: Secondary | ICD-10-CM | POA: Insufficient documentation

## 2023-09-10 DIAGNOSIS — M542 Cervicalgia: Secondary | ICD-10-CM | POA: Diagnosis present

## 2023-09-10 DIAGNOSIS — M5030 Other cervical disc degeneration, unspecified cervical region: Secondary | ICD-10-CM | POA: Diagnosis present

## 2023-09-10 MED ORDER — GADOBUTROL 1 MMOL/ML IV SOLN
10.0000 mL | Freq: Once | INTRAVENOUS | Status: AC | PRN
  Administered 2023-09-10: 10 mL via INTRAVENOUS

## 2023-10-12 ENCOUNTER — Other Ambulatory Visit: Payer: Self-pay | Admitting: Sports Medicine

## 2023-10-12 ENCOUNTER — Ambulatory Visit: Payer: Self-pay | Admitting: Sports Medicine

## 2023-10-12 DIAGNOSIS — M4802 Spinal stenosis, cervical region: Secondary | ICD-10-CM

## 2023-10-12 DIAGNOSIS — M5030 Other cervical disc degeneration, unspecified cervical region: Secondary | ICD-10-CM

## 2023-11-04 ENCOUNTER — Ambulatory Visit: Admitting: Orthopedic Surgery

## 2023-11-04 DIAGNOSIS — M5412 Radiculopathy, cervical region: Secondary | ICD-10-CM

## 2023-11-04 NOTE — Progress Notes (Signed)
 Orthopedic Spine Surgery Office Note  Assessment: Patient is a 38 y.o. female with intermittent symptoms that are consistent with myelopathy and does have central stenosis at C6/7 which is best seen on the axial cuts.  Has no upper motor neuron signs on physical exam   Plan: -I went over her symptoms with her that are concerning for myelopathy.  She does have some clumsiness with her hands and feels unsteady in the mornings.  She also has a Lhermitte sign.  She has no other physical exam's findings and her symptoms are not consistent at this time.  I explained that she is a younger patient and there are consequences to surgery such as adjacent segment disease that can lead to further surgery, so I want to continue to monitor her for now.  I said if she does develop progressive trouble with fine motor skills in the hands or unsteadiness, then I will recommend surgery.  I also want to perform repeat physical exams to see if she develops any symptoms of hyper reflexia or upper motor neuron signs.  If she does then we would also discuss surgery in that scenario -In order to treat her pain, recommended a cervical ESI.  She can continue with Tylenol  and Aleve  as needed -Patient should return to office in 3 months, x-rays at next visit: none   Patient expressed understanding of the plan and all questions were answered to the patient's satisfaction.   ___________________________________________________________________________   History:  Patient is a 38 y.o. female who presents today for cervical spine.  Patient has a history of about a year of neck pain going down the back of her spine and numbness and tingling in her hands.  She said has gotten significantly worse within the last 3 months.  She says she feels the pain starting in her neck and it goes down the midline of her back to the lower lumbar spine.  She notes it particularly with neck motion.  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  She  also has been feeling numbness and paresthesias in her hands and dorsal forearms.  She describes the pain in her midline as a burning pain.  She has tried over-the-counter medications and chiropractic treatment and has not noticed any relief with those.  There is no trauma or injury that preceded the onset of her symptoms.   Weakness: Yes, sometimes her hands feel weaker.  No consistent weakness Difficulty with fine motor skills (e.g., buttoning shirts, handwriting): Yes, has been dropping objects but is still able to button shirts and has not noticed difficulty with texting Symptoms of imbalance: Yes, mostly in the morning.  Does not have any consistent unsteadiness or imbalance Paresthesias and numbness: Yes, has numbness and paresthesias in her hand and dorsal forearms Bowel or bladder incontinence: Has a history of stress incontinence.  No recent changes in bowel or bladder habits Saddle anesthesia: Denies  Treatments tried: Tylenol , Aleve , chiropractor  Review of systems: Denies fevers and chills, night sweats, unexplained weight loss, history of cancer, pain that wakes them at night  Past medical history: History of STIs in the past  Allergies: NKDA  Past surgical history:  Tonsillectomy and adenoidectomy  Social history: Denies use of nicotine product (smoking, vaping, patches, smokeless) Alcohol use: Yes, approximately 2 drinks per week Denies recreational drug use   Physical Exam:  BMI of 31.2  General: no acute distress, appears stated age Neurologic: alert, answering questions appropriately, following commands Respiratory: unlabored breathing on  room air, symmetric chest rise Psychiatric: appropriate affect, normal cadence to speech   MSK (spine):  -Strength exam      Left  Right Grip strength                5/5  5/5 Interosseus   5/5   5/5 Wrist extension  5/5  5/5 Wrist flexion   5/5  5/5 Elbow flexion   5/5  5/5 Deltoid    5/5  5/5  -Sensory  exam    Sensation intact to light touch in C5-T1 nerve distributions of bilateral upper extremities  -Brachioradialis DTR: 2/4 on the left, 2/4 on the right -Biceps DTR: 2/4 on the left, 2/4 on the right  -Spurling: Negative bilaterally -Hoffman sign: Negative bilaterally -Clonus: No beats bilaterally -Interosseous wasting: None seen -Grip and release test: Negative -Romberg: Negative -Gait: Normal -Positive Lhermitte sign  Left shoulder exam: No pain through range of motion Right shoulder exam: No pain through range of motion  - Negative Tinel's, Durkan's, Phalen's bilaterally at the wrist  Imaging: XRs of the cervical spine from 08/17/2023 was independently reviewed and interpreted, showing disc height loss and small anterior osteophyte formation at C5/6.  No other significant degenerative changes seen.  No evidence of instability on flexion/extension views.  No fracture or dislocation seen.  MRI of the cervical spine from 09/10/2023 was independently reviewed and interpreted, showing left-sided paracentral disc herniation at C4/5 that appears noncompressive. Central stenosis and left-sided foraminal stenosis at C6/7.  No T2 cord signal change seen.  No other significant stenosis seen.   Patient name: Loretta Ellis Patient MRN: [id number] Date of visit: 11/04/23

## 2023-11-14 ENCOUNTER — Other Ambulatory Visit: Payer: Self-pay | Admitting: Physical Medicine and Rehabilitation

## 2023-11-14 ENCOUNTER — Telehealth: Payer: Self-pay

## 2023-11-14 MED ORDER — DIAZEPAM 5 MG PO TABS: ORAL_TABLET | ORAL | 0 refills | Status: DC

## 2023-11-14 NOTE — Telephone Encounter (Signed)
 Patient called to ask if any pain meds could be called in for her. We have her scheduled for her injection on 7/31. She is having difficultly sleeping. She uses [facility] on [address].

## 2023-11-16 ENCOUNTER — Encounter: Admitting: Orthopedic Surgery

## 2023-11-16 MED ORDER — TRAMADOL HCL 50 MG PO TABS: 50.0000 mg | ORAL_TABLET | Freq: Four times a day (QID) | ORAL | 0 refills | Status: AC | PRN

## 2023-11-16 NOTE — Addendum Note (Signed)
 Addended by: GEORGINA SHARPER on: 11/16/2023 03:24 AM   Modules accepted: Orders

## 2023-11-21 ENCOUNTER — Ambulatory Visit: Admitting: Physical Medicine and Rehabilitation

## 2023-11-21 ENCOUNTER — Other Ambulatory Visit: Payer: Self-pay

## 2023-11-21 VITALS — BP 129/90 | HR 80

## 2023-11-21 DIAGNOSIS — M5412 Radiculopathy, cervical region: Secondary | ICD-10-CM

## 2023-11-21 MED ORDER — METHYLPREDNISOLONE ACETATE 40 MG/ML IJ SUSP
40.0000 mg | Freq: Once | INTRAMUSCULAR | Status: AC
Start: 2023-11-21 — End: 2023-11-21
  Administered 2023-11-21: 40 mg

## 2023-11-21 NOTE — Progress Notes (Signed)
 Pain Scale   Average Pain 8 Patient advising she has chronic neck pain radiating to her bilateral shoulders and she never gets relief.        +Driver, -BT, -Dye Allergies.

## 2023-11-21 NOTE — Patient Instructions (Signed)

## 2023-11-27 NOTE — Progress Notes (Signed)
 Loretta Ellis - 38 y.o. female MRN [id number]  Date of birth: 12-28-1985  Office Visit Note: Visit Date: 11/21/2023 PCP: Benay Kay, PA-C Referred by: Georgina Ozell LABOR, MD  Subjective: Chief Complaint  Patient presents with   Neck - Pain   HPI:  Loretta Ellis is a 38 y.o. female who comes in today at the request of Dr. Ozell Georgina for planned Right C7-T1 Cervical Interlaminar epidural steroid injection with fluoroscopic guidance.  The patient has failed conservative care including home exercise, medications, time and activity modification.  This injection will be diagnostic and hopefully therapeutic.  Please see requesting physician notes for further details and justification.   ROS Otherwise per HPI.  Assessment & Plan: Visit Diagnoses:    ICD-10-CM   1. Cervical radiculopathy  M54.12 XR C-ARM NO REPORT    Epidural Steroid injection    methylPREDNISolone  acetate (DEPO-MEDROL ) injection 40 mg      Plan: No additional findings.   Meds & Orders:  Meds ordered this encounter  Medications   methylPREDNISolone  acetate (DEPO-MEDROL ) injection 40 mg    Orders Placed This Encounter  Procedures   XR C-ARM NO REPORT   Epidural Steroid injection    Follow-up: Return for visit to requesting provider as needed.   Procedures: No procedures performed  Cervical Epidural Steroid Injection - Interlaminar Approach with Fluoroscopic Guidance  Patient: Loretta Ellis      Date of Birth: [birth year] MRN: [id number] PCP: Benay Kay, PA-C      Visit Date: 11/21/2023   Universal Protocol:    Date/Time: 07/20/254:45 PM  Consent Given By: the patient  Position: PRONE  Additional Comments: Vital signs were monitored before and after the procedure. Patient was prepped and draped in the usual sterile fashion. The correct patient, procedure, and site was verified.   Injection Procedure Details:   Procedure diagnoses: Cervical radiculopathy [M54.12]    Meds  Administered:  Meds ordered this encounter  Medications   methylPREDNISolone  acetate (DEPO-MEDROL ) injection 40 mg     Laterality: Right  Location/Site: C7-T1  Needle: 3.5 in., 20 ga. Tuohy  Needle Placement: Paramedian epidural space  Findings:  -Comments: Excellent flow of contrast into the epidural space.  Procedure Details: Using a paramedian approach from the side mentioned above, the region overlying the inferior lamina was localized under fluoroscopic visualization and the soft tissues overlying this structure were infiltrated with 4 ml. of 1% Lidocaine  without Epinephrine. A # 20 gauge, Tuohy needle was inserted into the epidural space using a paramedian approach.  The epidural space was localized using loss of resistance along with contralateral oblique bi-planar fluoroscopic views.  After negative aspirate for air, blood, and CSF, a 2 ml. volume of Isovue-250 was injected into the epidural space and the flow of contrast was observed. Radiographs were obtained for documentation purposes.   The injectate was administered into the level noted above.  Additional Comments:  The patient tolerated the procedure well Dressing: 2 x 2 sterile gauze and Band-Aid    Post-procedure details: Patient was observed during the procedure. Post-procedure instructions were reviewed.  Patient left the clinic in stable condition.   Clinical History: MRI CERVICAL SPINE WITHOUT CONTRAST   TECHNIQUE: Multiplanar, multisequence MR imaging of the cervical spine was performed. No intravenous contrast was administered.   COMPARISON:  None Available.   FINDINGS: Alignment: Physiologic.   Vertebrae: Degenerative endplate marrow changes at a few levels. No acute fracture is identified.   Cord: Normal signal and morphology.  Posterior Fossa, vertebral arteries, paraspinal tissues: The visualized portions of the skull base and the posterior fossa are normal. No soft tissue abnormality  is identified.   Disc levels:   C2-C3: The disk is normal in configuration. No facet arthropathy. No uncovertebral joint disease. No neuroforaminal stenosis. No spinal canal stenosis.   C3-C4: Disc osteophyte complex. No facet arthropathy. Mild left uncovertebral joint disease. Mild left neuroforaminal stenosis. Mild spinal canal stenosis.   C4-C5: Disc osteophyte complex. No facet arthropathy. Moderate left uncovertebral joint disease. Moderate left neuroforaminal stenosis. Mild spinal canal stenosis.   C5-C6: Disc osteophyte complex. No facet arthropathy. Moderate bilateral uncovertebral joint disease. Moderate bilateral neuroforaminal stenosis. Mild spinal canal stenosis.   C6-C7: Disc osteophyte complex. No facet arthropathy. Severe bilateral uncovertebral joint disease. Severe bilateral neuroforaminal stenosis. Mild spinal canal stenosis.   C7-T1: The disk is normal in configuration. Mild bilateral facet arthropathy. No uncovertebral joint disease. No neuroforaminal stenosis. No spinal canal stenosis.   IMPRESSION: 1. 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 2. Moderate foraminal stenoses on the left at C4-C5 and bilaterally at C5-C6 secondary to uncovertebral joint disease. 3. Mild canal stenoses at C3-C4, C4-C5, C5-C6, and C6-C7.     Electronically Signed   By: Clem Savory M.D.   On: 10/10/2023 09:26     Objective:  VS:  HT:    WT:   BMI:     BP:(!) 129/90  HR:80bpm  TEMP: ( )  RESP:  Physical Exam Vitals and nursing note reviewed.  Constitutional:      General: She is not in acute distress.    Appearance: Normal appearance. She is not ill-appearing.  HENT:     Head: Normocephalic and atraumatic.     Right Ear: External ear normal.     Left Ear: External ear normal.  Eyes:     Extraocular Movements: Extraocular movements intact.  Cardiovascular:     Rate and Rhythm: Normal rate.     Pulses: Normal  pulses.  Musculoskeletal:     Cervical back: Tenderness present. No rigidity.     Right lower leg: No edema.     Left lower leg: No edema.     Comments: Patient has good strength in the upper extremities including 5 out of 5 strength in wrist extension long finger flexion and APB.  There is no atrophy of the hands intrinsically.  There is a negative Hoffmann's test.   Lymphadenopathy:     Cervical: No cervical adenopathy.  Skin:    Findings: No erythema, lesion or rash.  Neurological:     General: No focal deficit present.     Mental Status: She is alert and oriented to person, place, and time.     Sensory: No sensory deficit.     Motor: No weakness or abnormal muscle tone.     Coordination: Coordination normal.  Psychiatric:        Mood and Affect: Mood normal.        Behavior: Behavior normal.      Imaging: No results found.

## 2023-11-27 NOTE — Procedures (Signed)
 Cervical Epidural Steroid Injection - Interlaminar Approach with Fluoroscopic Guidance  Patient: Loretta Ellis      Date of Birth: 1985-07-02 MRN: [id number] PCP: Benay Kay, PA-C      Visit Date: 11/21/2023   Universal Protocol:    Date/Time: 07/20/254:45 PM  Consent Given By: the patient  Position: PRONE  Additional Comments: Vital signs were monitored before and after the procedure. Patient was prepped and draped in the usual sterile fashion. The correct patient, procedure, and site was verified.   Injection Procedure Details:   Procedure diagnoses: Cervical radiculopathy [M54.12]    Meds Administered:  Meds ordered this encounter  Medications   methylPREDNISolone  acetate (DEPO-MEDROL ) injection 40 mg     Laterality: Right  Location/Site: C7-T1  Needle: 3.5 in., 20 ga. Tuohy  Needle Placement: Paramedian epidural space  Findings:  -Comments: Excellent flow of contrast into the epidural space.  Procedure Details: Using a paramedian approach from the side mentioned above, the region overlying the inferior lamina was localized under fluoroscopic visualization and the soft tissues overlying this structure were infiltrated with 4 ml. of 1% Lidocaine  without Epinephrine. A # 20 gauge, Tuohy needle was inserted into the epidural space using a paramedian approach.  The epidural space was localized using loss of resistance along with contralateral oblique bi-planar fluoroscopic views.  After negative aspirate for air, blood, and CSF, a 2 ml. volume of Isovue-250 was injected into the epidural space and the flow of contrast was observed. Radiographs were obtained for documentation purposes.   The injectate was administered into the level noted above.  Additional Comments:  The patient tolerated the procedure well Dressing: 2 x 2 sterile gauze and Band-Aid    Post-procedure details: Patient was observed during the procedure. Post-procedure instructions were  reviewed.  Patient left the clinic in stable condition.

## 2023-12-08 ENCOUNTER — Encounter: Admitting: Physical Medicine and Rehabilitation

## 2023-12-11 ENCOUNTER — Encounter (HOSPITAL_BASED_OUTPATIENT_CLINIC_OR_DEPARTMENT_OTHER): Payer: Self-pay

## 2023-12-11 ENCOUNTER — Emergency Department (HOSPITAL_BASED_OUTPATIENT_CLINIC_OR_DEPARTMENT_OTHER)
Admission: EM | Admit: 2023-12-11 | Discharge: 2023-12-11 | Disposition: A | Discharge status: Home / Self Care | Attending: Emergency Medicine | Admitting: Emergency Medicine

## 2023-12-11 ENCOUNTER — Other Ambulatory Visit: Payer: Self-pay

## 2023-12-11 ENCOUNTER — Encounter: Payer: Self-pay | Admitting: Orthopedic Surgery

## 2023-12-11 DIAGNOSIS — I10 Essential (primary) hypertension: Secondary | ICD-10-CM | POA: Diagnosis not present

## 2023-12-11 DIAGNOSIS — Z79899 Other long term (current) drug therapy: Secondary | ICD-10-CM | POA: Insufficient documentation

## 2023-12-11 DIAGNOSIS — M5412 Radiculopathy, cervical region: Secondary | ICD-10-CM | POA: Insufficient documentation

## 2023-12-11 DIAGNOSIS — R200 Anesthesia of skin: Secondary | ICD-10-CM | POA: Diagnosis present

## 2023-12-11 MED ORDER — METHOCARBAMOL 500 MG PO TABS
750.0000 mg | ORAL_TABLET | Freq: Once | ORAL | Status: AC
  Administered 2023-12-11: 750 mg via ORAL
  Filled 2023-12-11: qty 2

## 2023-12-11 MED ORDER — OXYCODONE-ACETAMINOPHEN 5-325 MG PO TABS: 1.0000 | ORAL_TABLET | Freq: Four times a day (QID) | ORAL | 0 refills | Status: DC | PRN

## 2023-12-11 MED ORDER — OXYCODONE-ACETAMINOPHEN 5-325 MG PO TABS
2.0000 | ORAL_TABLET | Freq: Once | ORAL | Status: AC
  Administered 2023-12-11: 2 via ORAL
  Filled 2023-12-11: qty 2

## 2023-12-11 MED ORDER — METHOCARBAMOL 500 MG PO TABS: 500.0000 mg | ORAL_TABLET | Freq: Three times a day (TID) | ORAL | 0 refills | Status: DC | PRN

## 2023-12-11 MED ORDER — KETOROLAC TROMETHAMINE 60 MG/2ML IM SOLN
60.0000 mg | Freq: Once | INTRAMUSCULAR | Status: AC
  Administered 2023-12-11: 60 mg via INTRAMUSCULAR
  Filled 2023-12-11: qty 2

## 2023-12-11 MED ORDER — METHYLPREDNISOLONE 4 MG PO TBPK: ORAL_TABLET | ORAL | 0 refills | Status: DC

## 2023-12-11 MED ORDER — AMLODIPINE BESYLATE 5 MG PO TABS: 5.0000 mg | ORAL_TABLET | Freq: Every day | ORAL | 0 refills | Status: AC

## 2023-12-11 NOTE — ED Notes (Signed)
 Before administering medication, patient advised cannot drive herself home after receiving the ordered medications. Pt verbalizes understanding and states she will have her visitor drive her home, visitor agreeable.

## 2023-12-11 NOTE — ED Provider Notes (Signed)
 [place] EMERGENCY DEPARTMENT AT [facility] Provider Note   CSN: [id number] Arrival date & time: 12/11/23  1636     Patient presents with: Headache and Numbness (/)   Loretta Ellis is a 38 y.o. female.   HPI Patient is currently under the care of orthopedics with Dr. 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  Epidural steroid injection has been tried without relief.  Patient reports that the pain continues to worsen and she has pain on the back of her neck, posterior headache and now pain that encompasses all of the right arm and hand.  This has been progressive and patient has had pain for several years worsening over the past several months.  Patient denies any history of being treated with oral steroids.  She reports she has been given tramadol  for pain relief but it does not help.  Patient reports her blood pressures have been elevated recently.  She reports she was recently diagnosed with hypertension but has not been started on any medications.    Prior to Admission medications   Medication Sig Start Date End Date Taking? Authorizing Provider  amLODipine  (NORVASC ) 5 MG tablet Take 1 tablet (5 mg total) by mouth daily. 12/11/23 01/10/24 Yes Armenta Canning, MD  methocarbamol  (ROBAXIN ) 500 MG tablet Take 1 tablet (500 mg total) by mouth every 8 (eight) hours as needed for muscle spasms. 12/11/23  Yes Armenta Canning, MD  methylPREDNISolone  (MEDROL  DOSEPAK) 4 MG TBPK tablet Per dose pack instruction 12/11/23  Yes Kavitha Lansdale, Canning, MD  oxyCODONE -acetaminophen  (PERCOCET) 5-325 MG tablet Take 1-2 tablets by mouth every 6 (six) hours as needed. 12/11/23  Yes Pansey Pinheiro, Canning, MD  Azelastine HCl 0.15 % SOLN Place 2 Applications into the nose at bedtime. 09/23/22   [provider]  diazepam  (VALIUM ) 5 MG tablet Take one tablet (5 mg) by mouth with food one hour prior to procedure. May repeat 30  minutes prior to procedure if needed. 11/14/23   Williams, Megan E, NP  fluticasone (FLONASE) 50 MCG/ACT nasal spray Place 2 sprays into both nostrils daily as needed. 04/20/22   [provider]  hydrochlorothiazide (HYDRODIURIL) 25 MG tablet Take 25 mg by mouth daily.    [provider]  ibuprofen  (ADVIL ,MOTRIN ) 600 MG tablet Take 1 tablet (600 mg total) by mouth every 6 (six) hours as needed. 11/23/16   Trudy Earnie CROME, CNM  methylPREDNISolone  (MEDROL  DOSEPAK) 4 MG TBPK tablet Take per packet instruction. Taper dosing. 08/17/23   Brooks, Dana, DO  pantoprazole  (PROTONIX ) 40 MG tablet Take 1 tablet (40 mg total) by mouth daily. 12/13/22   Abran Norleen SAILOR, MD    Allergies: Patient has no known allergies.    Review of Systems  Updated Vital Signs BP (!) 133/93 (BP Location: Right Arm)   Pulse (!) 51   Temp 97.9 F (36.6 C) (Oral)   Resp 20   Ht 5' 9 (1.753 m)   Wt 93 kg   LMP 12/09/2023   SpO2 99%   BMI 30.27 kg/m   Physical Exam Constitutional:      Comments: Alert nontoxic.  Well-nourished well-developed.  No respiratory distress.  HENT:     Head: Normocephalic and atraumatic.     Right Ear: External ear normal.     Left Ear: External ear normal.     Nose: Nose normal.     Mouth/Throat:     Pharynx: Oropharynx is clear.  Eyes:     Extraocular Movements: Extraocular movements intact.  Neck:     Comments: Patient endorses significant pain to palpation is tearful to palpation of the paraspinous cervical muscle bodies and the trapezius on the right.  Objectively no soft tissue swelling.  Patient perceives soft tissue swelling on that side of her neck and shoulder.  Anterior neck soft and pliable.  Trachea is midline. Cardiovascular:     Rate and Rhythm: Normal rate and regular rhythm.  Pulmonary:     Effort: Pulmonary effort is normal.     Breath sounds: Normal breath sounds.  Abdominal:     General: There is no distension.     Palpations: Abdomen is soft.      Tenderness: There is no abdominal tenderness.  Musculoskeletal:     Comments: Examination of the shoulder, clavicular area and upper chest appear symmetric without objective swelling on the right.  Patient endorses a lot of tenderness palpation of the soft tissue muscle bodies of the right trapezius and upper back as well as the shoulder.  No joint effusions.  Of the right arm is symmetric with the left.  Soft tissues pliable and normal in appearance.  Radial pulse 2+.  Hand is warm and dry.  I can perform passive range of motion of the shoulder but she has pain at about 90 degrees.  Skin:    General: Skin is warm and dry.  Neurological:     Comments: Mental status clear.  Speech normal.  No focal motor deficits.  Patient perform grip strength on the left and the right which is grossly symmetric.  Patient is ambulatory without difficulty.  Psychiatric:     Comments: Patient is tearful and slightly anxious.     (all labs ordered are listed, but only abnormal results are displayed) Labs Reviewed - No data to display  EKG: None  Radiology: No results found.   Procedures   Medications Ordered in the ED  ketorolac  (TORADOL ) injection 60 mg (60 mg Intramuscular Given 12/11/23 1754)  methocarbamol  (ROBAXIN ) tablet 750 mg (750 mg Oral Given 12/11/23 1751)  oxyCODONE -acetaminophen  (PERCOCET/ROXICET) 5-325 MG per tablet 2 tablet (2 tablets Oral Given 12/11/23 1752)                                    Medical Decision Making Risk Prescription drug management.   Patient presents as outlined.  I have extensively reviewed EMR.  Patient does have recently documented MRI with and without contrast of the brain normal May 2025.  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  Patient's pain distribution seems consistent with this finding and her pain that is being treated by orthopedics.  Physical exam suggest significant  paraspinous muscle spasm and pain.  Will proceed with pain control with Toradol , Robaxin  and Percocet.  Consult: Reviewed with Dr. Georgina orthopedics.  He is familiar with the patient's case and care.  At this time this does sound consistent with exacerbation of chronic pain but no additional imaging indicated at this time.  Agrees with more aggressive approach to pain control and patient can follow-up in the office.  21: 15 patient reports pain is improved.  She still has some discomfort but it is improving and she has been able to rest.  At this time we will plan to treat patient's pain and radiculopathy with Medrol  Dosepak, Robaxin   and Percocet as needed.  Patient reports she has recently been diagnosed with hypertension.  She does not show any signs of endorgan damage from hypertension.  Patient's blood pressures in the emergency department have ranged from 130s to 150s systolic with diastolics ranging from mid 90s up to 109.  At this time patient does have findings consistent with diastolic hypertension.  I will opt to start amlodipine  5 mg.  Patient does have follow-up and is being evaluated.  I have also included discharge instructions with home care for hypertension.       Final diagnoses:  Cervical radiculopathy at C7  Uncontrolled hypertension    ED Discharge Orders          Ordered    methylPREDNISolone  (MEDROL  DOSEPAK) 4 MG TBPK tablet        12/11/23 2136    oxyCODONE -acetaminophen  (PERCOCET) 5-325 MG tablet  Every 6 hours PRN        12/11/23 2136    methocarbamol  (ROBAXIN ) 500 MG tablet  Every 8 hours PRN        12/11/23 2136    amLODipine  (NORVASC ) 5 MG tablet  Daily        12/11/23 2138               Armenta Canning, MD 12/11/23 2142

## 2023-12-11 NOTE — ED Triage Notes (Addendum)
 Pt reports headache x3 days and R arm numbness and pain. Pt recently dx w/ spinal stenosis and HTN. Pt is not on meds for HTN. Stroke screen negative.

## 2023-12-11 NOTE — Discharge Instructions (Addendum)
 1.  Start Medrol  Dosepak tomorrow as prescribed.  This is a steroid to help with inflammation.  You may take Robaxin  as prescribed every 6-8 hours.  This is a muscle relaxer.  You may take 1-2 Percocet every 6 hours if you need additional pain control. 2.  Follow-up with Dr. Georgina as soon as possible. 3.  Return to the emergency department if you have new or worsening symptoms. 4.  Follow instructions for hypertension.  You should be working with your family doctor to monitor this closely.  Sometimes pain or medications can increase blood pressure.  I have prescribed a low-dose blood pressure medication called amlodipine .  You may start taking this daily.  Monitor your blood pressures and write them down at home 3 times daily.  I have included information in your discharge instructions for actions you can take now.  Thank you for the opportunity to take care of you in our Emergency Department. You have been diagnosed with high blood pressure, also known as hypertension. This means that the force of blood against the walls of your blood vessels called is too strong. It also means that your heart has to work harder to move the blood. High blood pressure usually has no symptoms, but over time, it can cause serious health problems such as Heart attack and heart failure Stroke Kidney disease and failure Vision loss With the help from your healthcare provider and some important life style changes, you can manage your blood pressure and protect your health. Please read the instructions provided on hypertension, how to manage it and how to check your blood pressure. Additionally, use the blood pressure log provided to record your blood pressures. Take the blood pressure log with you to your primary care doctor so that they can adjust your blood pressure medications if needed. Please read the instructions on follow-up appointment. Return to the ER or Call 911 right away if you have any of these symptoms: Chest  pain or shortness of breath Severe headache Weakness, tingling, or numbness of your face, arms, or legs (especially on 1 side of the body) Sudden change in vision Confusion, trouble speaking, or trouble understanding speech

## 2023-12-12 ENCOUNTER — Encounter: Payer: Self-pay | Admitting: Radiology

## 2023-12-14 ENCOUNTER — Ambulatory Visit: Admitting: Orthopedic Surgery

## 2023-12-14 DIAGNOSIS — M5412 Radiculopathy, cervical region: Secondary | ICD-10-CM | POA: Diagnosis not present

## 2023-12-14 NOTE — Progress Notes (Signed)
 Orthopedic Spine Surgery Office Note   Assessment: Patient is a 38 y.o. female with neck pain that radiates into the right upper extremity along the lateral arm, dorsal forearm, and into the hand.  No radiating left arm pain.  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     Plan: -Patient has now had neck pain that radiates into the right upper extremity for 3 months without any relief with conservative treatments.  She has tried Tylenol , Aleve , chiropractor, Medrol  Dosepak, and narcotics, cervical ESI -Since her pain is severe and requiring narcotics to control it has not gotten better with 3 months of treatment, discussed operative management as an option.  After discussion, patient like to proceed -Patient would like to be seen at date of surgery     The patient has cervical radiculopathy, which was initially treated with non-operative measures. The patient's symptoms failed to improve with conservative treatments, so operative management was discussed in the form of C5-7 anterior cervical discectomy and fusion. The risks, including but not limited to pseudarthrosis, dysphagia, hematoma, airway compromise, recurrent laryngeal nerve injury, esophageal perforation, durotomy, spinal cord injury, nerve root injury, persistent pain, adjacent segment disease, infection, bleeding, hardware failure, vascular injury, heart attack, death, stroke, fracture, and need for additional procedures were discussed with the patient. The benefit of surgery would be improvement in the patient's radiating right arm pain. Explained that the patient may not get full relief of their pain with this surgery, especially any neck pain. The alternatives to surgical management would be continued monitoring, physical therapy, over-the-counter pain medications, injections, traction, and activity modification. All the patient's questions were answered to her satisfaction. After this discussion, the patient expressed  understanding and elected to proceed with surgical intervention.      ___________________________________________________________________________     History:   Patient is a 38 y.o. female who presents today for cervical spine.  Patient has now had 3 months of neck pain that radiates to the right upper extremity.  It has gotten worse within the last couple of weeks.  It got bad to the point that she had to go to the emergency department.  She has been taking prednisone  and narcotics for the last couple of days in order to try and control the pain.  She said she still has the pain and spite of those medications that she is taking now.  She does not have any pain rating to her left upper extremity.  She feels the pain going into the lateral aspect of the right arm, right dorsal forearm, and right hand.  She has noticed numbness and paresthesias in the radial digits of her hand.  She feels this pain on a daily basis and notes it is present even with rest.   Treatments tried: Tylenol , Aleve , chiropractor, Medrol  Dosepak, narcotics, cervical ESI     Physical Exam:   General: no acute distress, appears stated age Neurologic: alert, answering questions appropriately, following commands Respiratory: unlabored breathing on room air, symmetric chest rise Psychiatric: appropriate affect, normal cadence to speech     MSK (spine):   -Strength exam                                                   Left  Right Grip strength                5/5                  5/5 Interosseus                  5/5                  5/5 Wrist extension            5/5                  5/5 Wrist flexion                 5/5                  5/5 Elbow flexion                5/5                  5/5 Deltoid                          5/5                  5/5   -Sensory exam                           Sensation intact to light touch in C5-T1 nerve distributions of bilateral upper extremities   Imaging: XRs  of the cervical spine from 08/17/2023 was previously independently reviewed and interpreted, showing disc height loss and small anterior osteophyte formation at C5/6.  No other significant degenerative changes seen.  No evidence of instability on flexion/extension views.  No fracture or dislocation seen.   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 Central stenosis and left-sided foraminal stenosis at C6/7.  No T2 cord signal change seen.  No other significant stenosis seen.     Patient name: Loretta Ellis Patient MRN: [id number] Date of visit: 12/14/23

## 2024-01-10 NOTE — Progress Notes (Signed)
 Surgical Instructions   Your procedure is scheduled on Friday, September 5th. Report to [facility] Main Entrance A at 10:30 A.M., then check in with the Admitting office. Any questions or running late day of surgery: call [phone]  Questions prior to your surgery date: call [phone], Monday-Friday, 8am-4pm. If you experience any cold or flu symptoms such as cough, fever, chills, shortness of breath, etc. between now and your scheduled surgery, please notify us  at the above number.     Remember:  Do not eat after midnight the night before your surgery  You may drink clear liquids until 9:30 the morning of your surgery.   Clear liquids allowed are: Water, Non-Citrus Juices (without pulp), Carbonated Beverages, Clear Tea (no milk, honey, etc.), Black Coffee Only (NO MILK, CREAM OR POWDERED CREAMER of any kind), and Gatorade.  Patient Instructions  The night before surgery:  No food after midnight. ONLY clear liquids after midnight  The day of surgery (if you do NOT have diabetes):  Drink ONE (1) Pre-Surgery Clear Ensure by 9:30 the morning of surgery. Drink in one sitting. Do not sip.  This drink was given to you during your hospital pre-op appointment visit.  Nothing else to drink after completing the Pre-Surgery Clear Ensure.         If you have questions, please contact your surgeon's office.    Take these medicines the morning of surgery with A SIP OF WATER  amLODipine  (NORVASC )    May take these medicines IF NEEDED: methocarbamol  (ROBAXIN )  oxyCODONE -acetaminophen  (PERCOCET)  traMADol  (ULTRAM )    One week prior to surgery, STOP taking any Aspirin (unless otherwise instructed by your surgeon) Aleve , Naproxen , Goody's, BC's, all herbal medications, fish oil, and non-prescription vitamins. This includes your ibuprofen  (ADVIL ,MOTRIN ).                      Do NOT Smoke (Tobacco/Vaping) for 24 hours prior to your procedure.  If you use a CPAP at night, you may bring  your mask/headgear for your overnight stay.   You will be asked to remove any contacts, glasses, piercing's, hearing aid's, dentures/partials prior to surgery. Please bring cases for these items if needed.    Patients discharged the day of surgery will not be allowed to drive home, and someone needs to stay with them for 24 hours.  SURGICAL WAITING ROOM VISITATION Patients may have no more than 2 support people in the waiting area - these visitors may rotate.   Pre-op nurse will coordinate an appropriate time for 1 ADULT support person, who may not rotate, to accompany patient in pre-op.  Children under the age of 9 must have an adult with them who is not the patient and must remain in the main waiting area with an adult.  If the patient needs to stay at the hospital during part of their recovery, the visitor guidelines for inpatient rooms apply.  Please refer to the [facility] website for the visitor guidelines for any additional information.   If you received a COVID test during your pre-op visit  it is requested that you wear a mask when out in public, stay away from anyone that may not be feeling well and notify your surgeon if you develop symptoms. If you have been in contact with anyone that has tested positive in the last 10 days please notify you surgeon.      Pre-operative 5 CHG Bathing Instructions   You can play a key role in  reducing the risk of infection after surgery. Your skin needs to be as free of germs as possible. You can reduce the number of germs on your skin by washing with CHG (chlorhexidine  gluconate) soap before surgery. CHG is an antiseptic soap that kills germs and continues to kill germs even after washing.   DO NOT use if you have an allergy to chlorhexidine /CHG or antibacterial soaps. If your skin becomes reddened or irritated, stop using the CHG and notify one of our RNs at [phone].   Please shower with the CHG soap starting 4 days before surgery using  the following schedule:     Please keep in mind the following:  DO NOT shave, including legs and underarms, starting the day of your first shower.   You may shave your face at any point before/day of surgery.  Place clean sheets on your bed the day you start using CHG soap. Use a clean washcloth (not used since being washed) for each shower. DO NOT sleep with pets once you start using the CHG.   CHG Shower Instructions:  Wash your face and private area with normal soap. If you choose to wash your hair, wash first with your normal shampoo.  After you use shampoo/soap, rinse your hair and body thoroughly to remove shampoo/soap residue.  Turn the water OFF and apply about 3 tablespoons (45 ml) of CHG soap to a CLEAN washcloth.  Apply CHG soap ONLY FROM YOUR NECK DOWN TO YOUR TOES (washing for 3-5 minutes)  DO NOT use CHG soap on face, private areas, open wounds, or sores.  Pay special attention to the area where your surgery is being performed.  If you are having back surgery, having someone wash your back for you may be helpful. Wait 2 minutes after CHG soap is applied, then you may rinse off the CHG soap.  Pat dry with a clean towel  Put on clean clothes/pajamas   If you choose to wear lotion, please use ONLY the CHG-compatible lotions that are listed below.  Additional instructions for the day of surgery: DO NOT APPLY any lotions, deodorants, cologne, or perfumes.   Do not bring valuables to the hospital. [facility] is not responsible for any belongings/valuables. Do not wear nail polish, gel polish, artificial nails, or any other type of covering on natural nails (fingers and toes) Do not wear jewelry or makeup Put on clean/comfortable clothes.  Please brush your teeth.  Ask your nurse before applying any prescription medications to the skin.     CHG Compatible Lotions   Aveeno Moisturizing lotion  Cetaphil Moisturizing Cream  Cetaphil Moisturizing Lotion  Clairol Herbal  Essence Moisturizing Lotion, Dry Skin  Clairol Herbal Essence Moisturizing Lotion, Extra Dry Skin  Clairol Herbal Essence Moisturizing Lotion, Normal Skin  Curel Age Defying Therapeutic Moisturizing Lotion with Alpha Hydroxy  Curel Extreme Care Body Lotion  Curel Soothing Hands Moisturizing Hand Lotion  Curel Therapeutic Moisturizing Cream, Fragrance-Free  Curel Therapeutic Moisturizing Lotion, Fragrance-Free  Curel Therapeutic Moisturizing Lotion, Original Formula  Eucerin Daily Replenishing Lotion  Eucerin Dry Skin Therapy Plus Alpha Hydroxy Crme  Eucerin Dry Skin Therapy Plus Alpha Hydroxy Lotion  Eucerin Original Crme  Eucerin Original Lotion  Eucerin Plus Crme Eucerin Plus Lotion  Eucerin TriLipid Replenishing Lotion  Keri Anti-Bacterial Hand Lotion  Keri Deep Conditioning Original Lotion Dry Skin Formula Softly Scented  Keri Deep Conditioning Original Lotion, Fragrance Free Sensitive Skin Formula  Keri Lotion Fast Absorbing Fragrance Free Sensitive Skin Formula  Keri  Lotion Fast Absorbing Softly Scented Dry Skin Formula  Keri Original Lotion  Keri Skin Renewal Lotion Keri Silky Smooth Lotion  Keri Silky Smooth Sensitive Skin Lotion  Nivea Body Creamy Conditioning Oil  Nivea Body Extra Enriched Teacher, adult education Moisturizing Lotion Nivea Crme  Nivea Skin Firming Lotion  NutraDerm 30 Skin Lotion  NutraDerm Skin Lotion  NutraDerm Therapeutic Skin Cream  NutraDerm Therapeutic Skin Lotion  ProShield Protective Hand Cream  Provon moisturizing lotion  Please read over the following fact sheets that you were given.

## 2024-01-11 ENCOUNTER — Other Ambulatory Visit: Payer: Self-pay

## 2024-01-11 ENCOUNTER — Encounter (HOSPITAL_COMMUNITY)
Admission: RE | Admit: 2024-01-11 | Discharge: 2024-01-11 | Disposition: A | Discharge status: Home / Self Care | Source: Ambulatory Visit | Attending: Orthopedic Surgery | Admitting: Orthopedic Surgery

## 2024-01-11 ENCOUNTER — Encounter (HOSPITAL_COMMUNITY): Payer: Self-pay

## 2024-01-11 VITALS — BP 141/80 | HR 55 | Temp 97.9°F | Resp 17 | Ht 69.0 in | Wt 211.0 lb

## 2024-01-11 DIAGNOSIS — Z01818 Encounter for other preprocedural examination: Secondary | ICD-10-CM | POA: Diagnosis present

## 2024-01-11 DIAGNOSIS — Z8249 Family history of ischemic heart disease and other diseases of the circulatory system: Secondary | ICD-10-CM | POA: Diagnosis not present

## 2024-01-11 DIAGNOSIS — R001 Bradycardia, unspecified: Secondary | ICD-10-CM | POA: Diagnosis not present

## 2024-01-11 DIAGNOSIS — Z8679 Personal history of other diseases of the circulatory system: Secondary | ICD-10-CM | POA: Diagnosis not present

## 2024-01-11 DIAGNOSIS — I10 Essential (primary) hypertension: Secondary | ICD-10-CM

## 2024-01-11 DIAGNOSIS — M5412 Radiculopathy, cervical region: Secondary | ICD-10-CM | POA: Insufficient documentation

## 2024-01-11 DIAGNOSIS — Z862 Personal history of diseases of the blood and blood-forming organs and certain disorders involving the immune mechanism: Secondary | ICD-10-CM | POA: Diagnosis not present

## 2024-01-11 HISTORY — DX: Unspecified asthma, uncomplicated: J45.909

## 2024-01-11 LAB — BASIC METABOLIC PANEL WITH GFR
Anion gap: 8 (ref 5–15)
BUN: 8 mg/dL (ref 6–20)
CO2: 27 mmol/L (ref 22–32)
Calcium: 9.2 mg/dL (ref 8.9–10.3)
Chloride: 105 mmol/L (ref 98–111)
Creatinine, Ser: 0.81 mg/dL (ref 0.44–1.00)
GFR, Estimated: >60 mL/min (ref >60)
Glucose, Bld: 96 mg/dL (ref 70–99)
Potassium: 4.6 mmol/L (ref 3.5–5.1)
Sodium: 140 mmol/L (ref 135–145)

## 2024-01-11 LAB — CBC
HCT: 36.1 % (ref 36.0–46.0)
Hemoglobin: 11.3 g/dL — ABNORMAL LOW (ref 12.0–15.0)
MCH: 27.9 pg (ref 26.0–34.0)
MCHC: 31.3 g/dL (ref 30.0–36.0)
MCV: 89.1 fL (ref 80.0–100.0)
Platelets: 406 K/uL — ABNORMAL HIGH (ref 150–400)
RBC: 4.05 MIL/uL (ref 3.87–5.11)
RDW: 14.1 % (ref 11.5–15.5)
WBC: 7.3 K/uL (ref 4.0–10.5)
nRBC: 0 % (ref 0.0–0.2)

## 2024-01-11 LAB — SURGICAL PCR SCREEN
MRSA, PCR: NEGATIVE
Staphylococcus aureus: NEGATIVE

## 2024-01-11 NOTE — Progress Notes (Signed)
 Anesthesia APP PAT Evaluation:  Case: [id number] Date/Time: 01/13/24 1215   Procedure: ANTERIOR CERVICAL DECOMPRESSION/DISCECTOMY FUSION 2 LEVELS - C5-6, C6-7 ANTERIOR CERVICAL DISCECTOMY AND FUSION   Anesthesia type: General   Diagnosis: Cervical radiculopathy [M54.12]   Pre-op diagnosis: CERVICAL RADICULOPATHY   Location: [facility] ROOM 18 / [facility]   Surgeons: Georgina Ozell LABOR, MD       DISCUSSION: Patient is a 38 year old female scheduled for the above procedure.  History includes never smoker, anemia, murmur, pregnancy-induced hypertension, childhood asthma, T&A.  I evaluated her at PAT RN visit due to reported history of murmur since childhood.  She denies known echocardiogram and has never been put on activity restrictions.  Until recently she was actually involved in flag football.  Her mother does have a history of MI with CABG prior to the age of 53.  It sounds like she also has some carotid artery disease.  Her grandfather had an MI after the age of 53.  She denies exertional chest pain, shortness of breath, edema, syncope.  No significant palpitations.  She has been on oral iron for mild anemia but did not tolerate due to constipation.  I did not appreciate a murmur on exam. She is without CV/HF symptoms and recent activity tolerance of > 4 METS. EKGs showed SB at 51 bpm. CBC and BMP normal.   Anesthesia team to evaluate on the day of surgery.  VS: BP (!) 141/80   Pulse (!) 55   Temp 36.6 C   Resp 17   Ht 5' 9 (1.753 m)   Wt 95.7 kg   LMP 12/27/2023   SpO2 100%   BMI 31.16 kg/m  Patient wore mask, so provider wore face mask. She was a very please black female. NAD. Heart RRR, no murmur noted. Lungs clear. No carotid bruit noted.   PROVIDERS: No current PCP as she is between primary care providers.   LABS: Labs reviewed: Acceptable for surgery. (all labs ordered are listed, but only abnormal results are displayed)  Labs Reviewed  SURGICAL PCR SCREEN  CBC  BASIC METABOLIC  PANEL WITH GFR    IMAGES: MRI C-spine 09/10/2023: IMPRESSION: 1. 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 2. Moderate foraminal stenoses on the left at C4-C5 and bilaterally at C5-C6 secondary to uncovertebral joint disease. 3. Mild canal stenoses at C3-C4, C4-C5, C5-C6, and C6-C7.   MRI Brain 09/10/2023: IMPRESSION: Normal MRI of the brain with and without IV contrast.  EKG: 9/32025:  Sinus bradycardia at 51 bpm Otherwise normal ECG When compared with ECG of 13-Feb-2020 18:27, PREVIOUS ECG IS PRESENT HEART RATE DECREASED SINCE previous Confirmed by Pietro Rogue ([id number]) on 01/11/2024 4:24:30 PM   CV: N/A  Past Medical History:  Diagnosis Date   Anemia    Childhood asthma    Chlamydia    Gonorrhea    Heart murmur    No pertinent past medical history    Pregnancy induced hypertension     Past Surgical History:  Procedure Laterality Date   ADENOIDECTOMY     EAR TUBE REMOVAL     TONSILLECTOMY      MEDICATIONS:  amLODipine  (NORVASC ) 5 MG tablet   diazepam  (VALIUM ) 5 MG tablet   ibuprofen  (ADVIL ,MOTRIN ) 600 MG tablet   methocarbamol  (ROBAXIN ) 500 MG tablet   methylPREDNISolone  (MEDROL  DOSEPAK) 4 MG TBPK tablet   methylPREDNISolone  (MEDROL  DOSEPAK) 4 MG TBPK tablet   oxyCODONE -acetaminophen  (PERCOCET) 5-325 MG tablet   pantoprazole  (PROTONIX ) 40 MG  tablet   traMADol  (ULTRAM ) 50 MG tablet   No current facility-administered medications for this encounter.   She is not currently taking Valium , ibuprofen , Medrol  Dosepak, Protonix .  Isaiah Ruder, PA-C Surgical Short Stay/Anesthesiology [facility] Phone [phone] [facility] Phone [phone] 01/11/2024 5:02 PM

## 2024-01-11 NOTE — Progress Notes (Signed)
 PCP - no PCP per patient- pt unsure of her previous PCP's name.  Cardiologist - denies  PPM/ICD - denies   Chest x-ray - denies EKG - 01/11/2024  Stress Test - denies ECHO - denies Cardiac Cath - denies  Sleep Study - denies   Fasting Blood Sugar - N/A   Last dose of GLP1 agonist-  N/A  Blood Thinner Instructions: N/A Aspirin Instructions:N/A  ERAS Protcol - ERAS per order + ensure   COVID TEST-  N/A   Anesthesia review: yesGLENWOOD Rake to see pt at PAT d/t history of heart murmur  Patient denies shortness of breath, fever, cough and chest pain at PAT appointment   All instructions explained to the patient, with a verbal understanding of the material. Patient agrees to go over the instructions while at home for a better understanding. The opportunity to ask questions was provided.

## 2024-01-11 NOTE — Anesthesia Preprocedure Evaluation (Signed)
 Anesthesia Evaluation  Patient identified by MRN, date of birth, ID band Patient awake    Reviewed: Allergy & Precautions, NPO status , Patient's Chart, lab work & pertinent test results  History of Anesthesia Complications Negative for: history of anesthetic complications  Airway Mallampati: II  TM Distance: >3 FB Neck ROM: Limited    Dental  (+) Dental Advisory Given, Teeth Intact   Pulmonary asthma    Pulmonary exam normal        Cardiovascular hypertension, Pt. on medications Normal cardiovascular exam     Neuro/Psych  negative psych ROS   GI/Hepatic negative GI ROS, Neg liver ROS,,,  Endo/Other   Obesity   Renal/GU negative Renal ROS     Musculoskeletal negative musculoskeletal ROS (+)    Abdominal   Peds  Hematology negative hematology ROS (+)   Anesthesia Other Findings   Reproductive/Obstetrics  Hx PIH                               Anesthesia Physical Anesthesia Plan  ASA: 2  Anesthesia Plan: General   Post-op Pain Management: Tylenol  PO (pre-op)* and Celebrex  PO (pre-op)*   Induction: Intravenous  PONV Risk Score and Plan: 3 and Treatment may vary due to age or medical condition, Ondansetron , Dexamethasone , Midazolam  and Scopolamine  patch - Pre-op  Airway Management Planned: Oral ETT and Video Laryngoscope Planned  Additional Equipment: None  Intra-op Plan:   Post-operative Plan: Extubation in OR  Informed Consent: I have reviewed the patients History and Physical, chart, labs and discussed the procedure including the risks, benefits and alternatives for the proposed anesthesia with the patient or authorized representative who has indicated his/her understanding and acceptance.     Dental advisory given  Plan Discussed with: CRNA and Anesthesiologist  Anesthesia Plan Comments: (PAT note written 01/11/2024 by Kambry Takacs, PA-C.  )          Anesthesia Quick Evaluation

## 2024-01-12 NOTE — H&P (Signed)
 Orthopedic Spine Surgery H&P Note  Assessment: Patient is a 38 y.o. female with cervical radiculopathy   Plan: -Out of bed as tolerated, activity as tolerated, no brace -Covered the risks of surgery one more time with the patient and patient elected to proceed with planned surgery -Written consent verified -Hold anticoagulation in anticipation of surgery -Ancef  and TXA on all to OR -NPO for procedure -Site marked -To OR when ready   The risks covered today included but were not limited to: pseudarthrosis, dysphagia, hematoma, airway compromise, recurrent laryngeal nerve injury, esophageal perforation, durotomy, spinal cord injury, nerve root injury, persistent pain, adjacent segment disease, infection, bleeding, hardware failure, vascular injury, heart attack, death, stroke, dvt/pe, and need for additional procedures.  ___________________________________________________________________________  Chief Complaint: neck pain that radiates into the right upper extremity  History: Patient is 38 y.o. female who has been previously seen in the office for neck pain that goes into her right lateral arm, dorsal forearm, and hand. Work up was consistent with cervical radiculopathy.  Her symptoms failed to improve with conservative treatment so operative management was discussed at the last office visit. The patient presents today with no changes in her symptoms since the last office visit. See previous office note for further details.    Review of systems: General: denies fevers and chills, myalgias Neurologic: denies recent changes in vision, slurred speech Abdomen: denies nausea, vomiting, hematemesis Respiratory: denies cough, shortness of breath  Past medical history: History of STIs in the past   Allergies: NKDA   Past surgical history:  Tonsillectomy and adenoidectomy   Social history: Denies use of nicotine product (smoking, vaping, patches, smokeless) Alcohol use: Yes,  approximately 2 drinks per week Denies recreational drug use  Family history: -reviewed and not pertinent to cervical radiculopathy   Physical Exam:  General: no acute distress, appears stated age Neurologic: alert, answering questions appropriately, following commands Cardiovascular: regular rate, no cyanosis Respiratory: unlabored breathing on room air, symmetric chest rise Psychiatric: appropriate affect, normal cadence to speech   MSK (spine):  -Strength exam      Left  Right Grip strength                5/5  5/5 Interosseus   5/5   5/5 Wrist extension  5/5  5/5 Wrist flexion   5/5  5/5 Elbow extension  5/5  5/5 Elbow flexion   5/5  5/5 Deltoid    5/5  5/5  -Sensory exam    Sensation intact to light touch in C5-T1 nerve distributions of bilateral upper extremities   Patient name: Loretta Ellis Patient MRN: [id number] Date: 01/13/2024

## 2024-01-13 ENCOUNTER — Ambulatory Visit (HOSPITAL_BASED_OUTPATIENT_CLINIC_OR_DEPARTMENT_OTHER): Payer: Self-pay | Admitting: Anesthesiology

## 2024-01-13 ENCOUNTER — Encounter (HOSPITAL_COMMUNITY)
Admission: RE | Disposition: A | Discharge status: Home / Self Care | Payer: Self-pay | Source: Home / Self Care | Attending: Orthopedic Surgery

## 2024-01-13 ENCOUNTER — Ambulatory Visit (HOSPITAL_COMMUNITY)

## 2024-01-13 ENCOUNTER — Encounter (HOSPITAL_COMMUNITY): Payer: Self-pay | Admitting: Orthopedic Surgery

## 2024-01-13 ENCOUNTER — Other Ambulatory Visit: Payer: Self-pay

## 2024-01-13 ENCOUNTER — Ambulatory Visit (HOSPITAL_COMMUNITY): Payer: Self-pay | Admitting: Vascular Surgery

## 2024-01-13 ENCOUNTER — Observation Stay (HOSPITAL_COMMUNITY)
Admission: RE | Admit: 2024-01-13 | Discharge: 2024-01-14 | Disposition: A | Discharge status: Home / Self Care | Attending: Orthopedic Surgery | Admitting: Orthopedic Surgery

## 2024-01-13 DIAGNOSIS — M5412 Radiculopathy, cervical region: Secondary | ICD-10-CM

## 2024-01-13 DIAGNOSIS — Z01818 Encounter for other preprocedural examination: Principal | ICD-10-CM

## 2024-01-13 DIAGNOSIS — J45909 Unspecified asthma, uncomplicated: Secondary | ICD-10-CM | POA: Insufficient documentation

## 2024-01-13 DIAGNOSIS — I10 Essential (primary) hypertension: Secondary | ICD-10-CM | POA: Insufficient documentation

## 2024-01-13 DIAGNOSIS — F1090 Alcohol use, unspecified, uncomplicated: Secondary | ICD-10-CM | POA: Insufficient documentation

## 2024-01-13 DIAGNOSIS — M4802 Spinal stenosis, cervical region: Secondary | ICD-10-CM | POA: Insufficient documentation

## 2024-01-13 DIAGNOSIS — Z981 Arthrodesis status: Secondary | ICD-10-CM

## 2024-01-13 HISTORY — PX: ANTERIOR CERVICAL DECOMP/DISCECTOMY FUSION: SHX1161

## 2024-01-13 LAB — POCT PREGNANCY, URINE: Preg Test, Ur: NEGATIVE

## 2024-01-13 SURGERY — ANTERIOR CERVICAL DECOMPRESSION/DISCECTOMY FUSION 2 LEVELS
Anesthesia: General | Site: Spine Cervical

## 2024-01-13 MED ORDER — PROPOFOL 10 MG/ML IV BOLUS
INTRAVENOUS | Status: AC
  Filled 2024-01-13: qty 20

## 2024-01-13 MED ORDER — CEFAZOLIN SODIUM-DEXTROSE 2-4 GM/100ML-% IV SOLN
2.0000 g | Freq: Four times a day (QID) | INTRAVENOUS | Status: AC
  Administered 2024-01-13 (×2): 2 g via INTRAVENOUS
  Filled 2024-01-13 (×2): qty 100

## 2024-01-13 MED ORDER — FENTANYL CITRATE (PF) 100 MCG/2ML IJ SOLN
INTRAMUSCULAR | Status: AC
Start: 2024-01-13 — End: 2024-01-13
  Filled 2024-01-13: qty 2

## 2024-01-13 MED ORDER — ONDANSETRON HCL 4 MG/2ML IJ SOLN: 4.0000 mg | Freq: Four times a day (QID) | INTRAMUSCULAR | Status: DC | PRN

## 2024-01-13 MED ORDER — POVIDONE-IODINE 10 % EX SWAB
2.0000 | Freq: Once | CUTANEOUS | Status: AC
  Administered 2024-01-13: 2 via TOPICAL

## 2024-01-13 MED ORDER — ACETAMINOPHEN 500 MG PO TABS
1000.0000 mg | ORAL_TABLET | Freq: Once | ORAL | Status: AC
  Administered 2024-01-13: 1000 mg via ORAL
  Filled 2024-01-13: qty 2

## 2024-01-13 MED ORDER — OXYCODONE HCL 5 MG PO TABS: 5.0000 mg | ORAL_TABLET | Freq: Once | ORAL | Status: DC | PRN

## 2024-01-13 MED ORDER — AMISULPRIDE (ANTIEMETIC) 5 MG/2ML IV SOLN: 10.0000 mg | Freq: Once | INTRAVENOUS | Status: DC | PRN

## 2024-01-13 MED ORDER — MIDAZOLAM HCL 2 MG/2ML IJ SOLN
INTRAMUSCULAR | Status: DC | PRN
  Administered 2024-01-13: 2 mg via INTRAVENOUS

## 2024-01-13 MED ORDER — MIDAZOLAM HCL 2 MG/2ML IJ SOLN
INTRAMUSCULAR | Status: AC
  Filled 2024-01-13: qty 2

## 2024-01-13 MED ORDER — HYDROMORPHONE HCL 1 MG/ML IJ SOLN
0.5000 mg | INTRAMUSCULAR | Status: DC | PRN
  Administered 2024-01-13: 0.5 mg via INTRAVENOUS
  Filled 2024-01-13: qty 0.5

## 2024-01-13 MED ORDER — DEXMEDETOMIDINE HCL IN NACL 80 MCG/20ML IV SOLN
INTRAVENOUS | Status: DC | PRN
  Administered 2024-01-13: 4 ug via INTRAVENOUS
  Administered 2024-01-13 (×2): 8 ug via INTRAVENOUS

## 2024-01-13 MED ORDER — SENNA 8.6 MG PO TABS
1.0000 | ORAL_TABLET | Freq: Two times a day (BID) | ORAL | Status: DC
  Administered 2024-01-13 – 2024-01-14 (×2): 8.6 mg via ORAL
  Filled 2024-01-13 (×2): qty 1

## 2024-01-13 MED ORDER — ORAL CARE MOUTH RINSE: 15.0000 mL | Freq: Once | OROMUCOSAL | Status: AC

## 2024-01-13 MED ORDER — TRANEXAMIC ACID-NACL 1000-0.7 MG/100ML-% IV SOLN
1000.0000 mg | Freq: Once | INTRAVENOUS | Status: AC
  Administered 2024-01-13: 1000 mg via INTRAVENOUS
  Filled 2024-01-13: qty 100

## 2024-01-13 MED ORDER — SCOPOLAMINE 1 MG/3DAYS TD PT72
1.0000 | MEDICATED_PATCH | TRANSDERMAL | Status: DC
  Administered 2024-01-13: 1 mg via TRANSDERMAL
  Filled 2024-01-13: qty 1

## 2024-01-13 MED ORDER — LIDOCAINE 2% (20 MG/ML) 5 ML SYRINGE
INTRAMUSCULAR | Status: DC | PRN
  Administered 2024-01-13: 60 mg via INTRAVENOUS
  Administered 2024-01-13: 40 mg via INTRAVENOUS

## 2024-01-13 MED ORDER — CHLORHEXIDINE GLUCONATE 0.12 % MT SOLN
15.0000 mL | Freq: Once | OROMUCOSAL | Status: AC
  Administered 2024-01-13: 15 mL via OROMUCOSAL
  Filled 2024-01-13: qty 15

## 2024-01-13 MED ORDER — LACTATED RINGERS IV SOLN: INTRAVENOUS | Status: DC

## 2024-01-13 MED ORDER — POLYETHYLENE GLYCOL 3350 17 G PO PACK
17.0000 g | PACK | Freq: Every day | ORAL | Status: DC
  Administered 2024-01-14: 17 g via ORAL
  Filled 2024-01-13: qty 1

## 2024-01-13 MED ORDER — FENTANYL CITRATE (PF) 100 MCG/2ML IJ SOLN
25.0000 ug | INTRAMUSCULAR | Status: DC | PRN
  Administered 2024-01-13 (×2): 50 ug via INTRAVENOUS

## 2024-01-13 MED ORDER — PROPOFOL 10 MG/ML IV BOLUS
INTRAVENOUS | Status: DC | PRN
  Administered 2024-01-13: 200 mg via INTRAVENOUS

## 2024-01-13 MED ORDER — HYDROCODONE-ACETAMINOPHEN 5-325 MG PO TABS
1.0000 | ORAL_TABLET | ORAL | Status: DC | PRN
  Administered 2024-01-13 – 2024-01-14 (×4): 2 via ORAL
  Filled 2024-01-13 (×4): qty 2

## 2024-01-13 MED ORDER — ONDANSETRON HCL 4 MG/2ML IJ SOLN
INTRAMUSCULAR | Status: DC | PRN
  Administered 2024-01-13: 4 mg via INTRAVENOUS

## 2024-01-13 MED ORDER — PHENOL 1.4 % MT LIQD: 1.0000 | OROMUCOSAL | Status: DC | PRN

## 2024-01-13 MED ORDER — HYDROMORPHONE HCL 1 MG/ML IJ SOLN
INTRAMUSCULAR | Status: DC | PRN
  Administered 2024-01-13: .5 mg via INTRAVENOUS

## 2024-01-13 MED ORDER — SUGAMMADEX SODIUM 200 MG/2ML IV SOLN
INTRAVENOUS | Status: DC | PRN
Start: 2024-01-13 — End: 2024-01-13
  Administered 2024-01-13: 200 mg via INTRAVENOUS

## 2024-01-13 MED ORDER — 0.9 % SODIUM CHLORIDE (POUR BTL) OPTIME
TOPICAL | Status: DC | PRN
Start: 2024-01-13 — End: 2024-01-13
  Administered 2024-01-13: 1000 mL

## 2024-01-13 MED ORDER — DEXAMETHASONE SODIUM PHOSPHATE 10 MG/ML IJ SOLN
10.0000 mg | Freq: Once | INTRAMUSCULAR | Status: AC
  Administered 2024-01-13: 10 mg via INTRAVENOUS
  Filled 2024-01-13: qty 1

## 2024-01-13 MED ORDER — CEFAZOLIN SODIUM-DEXTROSE 2-4 GM/100ML-% IV SOLN
2.0000 g | INTRAVENOUS | Status: AC
  Administered 2024-01-13: 2 g via INTRAVENOUS
  Filled 2024-01-13: qty 100

## 2024-01-13 MED ORDER — ROCURONIUM BROMIDE 10 MG/ML (PF) SYRINGE
PREFILLED_SYRINGE | INTRAVENOUS | Status: DC | PRN
  Administered 2024-01-13: 30 mg via INTRAVENOUS
  Administered 2024-01-13 (×2): 20 mg via INTRAVENOUS
  Administered 2024-01-13: 70 mg via INTRAVENOUS

## 2024-01-13 MED ORDER — FENTANYL CITRATE (PF) 250 MCG/5ML IJ SOLN
INTRAMUSCULAR | Status: AC
  Filled 2024-01-13: qty 5

## 2024-01-13 MED ORDER — OXYCODONE HCL 5 MG/5ML PO SOLN: 5.0000 mg | Freq: Once | ORAL | Status: DC | PRN

## 2024-01-13 MED ORDER — CELECOXIB 200 MG PO CAPS
200.0000 mg | ORAL_CAPSULE | Freq: Once | ORAL | Status: AC
  Administered 2024-01-13: 200 mg via ORAL
  Filled 2024-01-13: qty 1

## 2024-01-13 MED ORDER — AMLODIPINE BESYLATE 5 MG PO TABS
5.0000 mg | ORAL_TABLET | Freq: Every day | ORAL | Status: DC
  Administered 2024-01-14: 5 mg via ORAL
  Filled 2024-01-13: qty 1

## 2024-01-13 MED ORDER — FENTANYL CITRATE (PF) 250 MCG/5ML IJ SOLN
INTRAMUSCULAR | Status: DC | PRN
  Administered 2024-01-13 (×2): 50 ug via INTRAVENOUS
  Administered 2024-01-13: 100 ug via INTRAVENOUS
  Administered 2024-01-13: 50 ug via INTRAVENOUS

## 2024-01-13 MED ORDER — HYDROMORPHONE HCL 1 MG/ML IJ SOLN
INTRAMUSCULAR | Status: AC
  Filled 2024-01-13: qty 0.5

## 2024-01-13 MED ORDER — TRANEXAMIC ACID-NACL 1000-0.7 MG/100ML-% IV SOLN
1000.0000 mg | INTRAVENOUS | Status: AC
  Administered 2024-01-13: 1000 mg via INTRAVENOUS
  Filled 2024-01-13: qty 100

## 2024-01-13 MED ORDER — SODIUM CHLORIDE 0.9 % IV SOLN: 12.5000 mg | INTRAVENOUS | Status: DC | PRN

## 2024-01-13 MED ORDER — ONDANSETRON HCL 4 MG PO TABS: 4.0000 mg | ORAL_TABLET | Freq: Four times a day (QID) | ORAL | Status: DC | PRN

## 2024-01-13 MED ORDER — METHOCARBAMOL 500 MG PO TABS
500.0000 mg | ORAL_TABLET | Freq: Four times a day (QID) | ORAL | Status: DC | PRN
  Administered 2024-01-13 – 2024-01-14 (×3): 500 mg via ORAL
  Filled 2024-01-13 (×3): qty 1

## 2024-01-13 SURGICAL SUPPLY — 64 items
ALCOHOL 70% 16 OZ (MISCELLANEOUS) ×1 IMPLANT
ALLOGRAFT LORDOTIC CC 7X11X14 (Bone Implant) IMPLANT
ALLOGRAFT TRIAD LORDOTIC CC (Bone Implant) IMPLANT
BAG COUNTER SPONGE SURGICOUNT (BAG) ×1 IMPLANT
BAND RUBBER #18 3X1/16 STRL (MISCELLANEOUS) ×2 IMPLANT
BENZOIN TINCTURE PRP APPL 2/3 (GAUZE/BANDAGES/DRESSINGS) IMPLANT
BLADE CLIPPER SURG (BLADE) IMPLANT
BUR MATCHSTICK NEURO 3.0 LAGG (BURR) ×1 IMPLANT
CABLE BIPOLOR RESECTION CORD (MISCELLANEOUS) ×1 IMPLANT
CANISTER SUCTION 3000ML PPV (SUCTIONS) ×1 IMPLANT
CLSR STERI-STRIP ANTIMIC 1/2X4 (GAUZE/BANDAGES/DRESSINGS) ×1 IMPLANT
COVER MAYO STAND STRL (DRAPES) ×2 IMPLANT
COVER SURGICAL LIGHT HANDLE (MISCELLANEOUS) ×2 IMPLANT
DERMABOND ADVANCED .7 DNX12 (GAUZE/BANDAGES/DRESSINGS) IMPLANT
DRAPE C-ARM 42X72 X-RAY (DRAPES) ×1 IMPLANT
DRAPE LAPAROTOMY 100X72X124 (DRAPES) ×1 IMPLANT
DRAPE MICROSCOPE LEICA (MISCELLANEOUS) ×1 IMPLANT
DRAPE MICROSCOPE NONGLARE (MISCELLANEOUS) ×1 IMPLANT
DRAPE POUCH INSTRU U-SHP 10X18 (DRAPES) ×1 IMPLANT
DRAPE SURG 17X11 SM STRL (DRAPES) ×4 IMPLANT
DRAPE SURG 17X23 STRL (DRAPES) ×1 IMPLANT
DRAPE U-SHAPE 47X51 STRL (DRAPES) ×1 IMPLANT
DRAPE UTILITY XL STRL (DRAPES) ×1 IMPLANT
DRSG OPSITE POSTOP 3X4 (GAUZE/BANDAGES/DRESSINGS) ×1 IMPLANT
DRSG TEGADERM 4X4.75 (GAUZE/BANDAGES/DRESSINGS) IMPLANT
DURAPREP 26ML APPLICATOR (WOUND CARE) ×1 IMPLANT
ELECT COATED BLADE 2.86 ST (ELECTRODE) ×1 IMPLANT
ELECTRODE BLADE INSULATED 4IN (ELECTROSURGICAL) ×1 IMPLANT
ELECTRODE REM PT RTRN 9FT ADLT (ELECTROSURGICAL) ×1 IMPLANT
GAUZE SPONGE 4X4 12PLY STRL (GAUZE/BANDAGES/DRESSINGS) IMPLANT
GLOVE BIO SURGEON STRL SZ7.5 (GLOVE) ×2 IMPLANT
GLOVE INDICATOR 7.5 STRL GRN (GLOVE) ×1 IMPLANT
GOWN STRL REUS W/ TWL LRG LVL3 (GOWN DISPOSABLE) ×1 IMPLANT
GOWN STRL SURGICAL XL XLNG (GOWN DISPOSABLE) ×1 IMPLANT
KIT BASIN OR (CUSTOM PROCEDURE TRAY) ×1 IMPLANT
KIT TURNOVER KIT B (KITS) ×1 IMPLANT
NDL HYPO 22X1.5 SAFETY MO (MISCELLANEOUS) ×1 IMPLANT
NDL SPNL 18GX3.5 QUINCKE PK (NEEDLE) ×1 IMPLANT
NEEDLE HYPO 22X1.5 SAFETY MO (MISCELLANEOUS) ×1 IMPLANT
NEEDLE SPNL 18GX3.5 QUINCKE PK (NEEDLE) ×1 IMPLANT
NS IRRIG 1000ML POUR BTL (IV SOLUTION) ×1 IMPLANT
PACK ORTHO CERVICAL (CUSTOM PROCEDURE TRAY) ×1 IMPLANT
PACK UNIVERSAL I (CUSTOM PROCEDURE TRAY) ×1 IMPLANT
PAD ARMBOARD POSITIONER FOAM (MISCELLANEOUS) ×2 IMPLANT
PENCIL BUTTON HOLSTER BLD 10FT (ELECTRODE) ×1 IMPLANT
PIN DISTRACTION MAXCESS-C 14 (PIN) IMPLANT
PLATE ACP 1.6X36 2LVL (Plate) IMPLANT
POSITIONER HEAD DONUT 9IN (MISCELLANEOUS) ×1 IMPLANT
RESTRAINT LIMB HOLDER UNIV (RESTRAINTS) ×1 IMPLANT
SCREW ACP VA SD 3.5X15 (Screw) IMPLANT
SPONGE INTESTINAL PEANUT (DISPOSABLE) ×1 IMPLANT
SPONGE SURGIFOAM ABS GEL SZ50 (HEMOSTASIS) ×1 IMPLANT
STRIP CLOSURE SKIN 1/2X4 (GAUZE/BANDAGES/DRESSINGS) IMPLANT
SURGIFLO W/THROMBIN 8M KIT (HEMOSTASIS) IMPLANT
SUT BONE WAX W31G (SUTURE) ×1 IMPLANT
SUT MNCRL AB 3-0 PS2 27 (SUTURE) ×1 IMPLANT
SUT VIC AB 2-0 CT2 18 VCP726D (SUTURE) ×2 IMPLANT
SYR BULB IRRIG 60ML STRL (SYRINGE) ×1 IMPLANT
TAPE CLOTH 4X10 WHT NS (GAUZE/BANDAGES/DRESSINGS) ×1 IMPLANT
TOWEL GREEN STERILE (TOWEL DISPOSABLE) ×1 IMPLANT
TOWEL GREEN STERILE FF (TOWEL DISPOSABLE) ×1 IMPLANT
TRAY FOLEY W/BAG SLVR 16FR ST (SET/KITS/TRAYS/PACK) ×1 IMPLANT
TUBING FEATHERFLOW (TUBING) ×1 IMPLANT
WATER STERILE IRR 1000ML POUR (IV SOLUTION) ×1 IMPLANT

## 2024-01-13 NOTE — Progress Notes (Signed)
 Orthopedic Surgery Post-operative Progress Note  Assessment: Patient is a 38 y.o. female who is currently admitted after undergoing C5-7 ACDF   Plan: -Operative plans complete -Needs upright films when able -Drain will be removed tomorrow morning -Out of bed as tolerated with aspen collar -No bending/lifting/twisting greater than 10 pounds -OT evaluation and treat -Pain control -Diabetic diet (I realize patient is not diabetic but they diabetic diet still allow for cake, orange juice, and pancakes at [employer] so I placed her on the diabetic diet to minimize her carbohydrates and so she is eating other food groups) -Ancef  x2 post-operative doses -No antiplatelet or dvt chemoprophylaxis for 72 hours after surgery -Disposition: to floor from PACU  _________________________________________________________________________  Subjective: No acute events since surgery. Recovering in PACU. Pain well controlled.    Objective:  General: no acute distress, appropriate affect Neurologic: alert, answering questions appropriately, following commands Respiratory: unlabored breathing on room air Neck: no hematoma appreciated Skin: dressing clear/dry/intact  MSK (spine):  -Strength exam      Right  Left Grip strength                5/5  5/5 Interosseus   5/5   5/5 Wrist extension  5/5  5/5 Wrist flexion   5/5  5/5 Elbow flexion   5/5  5/5 Deltoid    5/5  5/5  -Sensory exam    Sensation intact to light touch in C5-T1 nerve distributions of bilateral upper extremities   Yesterday's total administered Morphine  Milligram Equivalents: 0   Patient name: Loretta Ellis Patient MRN: [id number] Date: 01/13/24

## 2024-01-13 NOTE — Transfer of Care (Signed)
 Immediate Anesthesia Transfer of Care Note  Patient: Loretta Ellis  Procedure(s) Performed: ANTERIOR CERVICAL DECOMPRESSION/DISCECTOMY FUSION 2 LEVELS (Spine Cervical)  Patient Location: PACU  Anesthesia Type:General  Level of Consciousness: drowsy  Airway & Oxygen Therapy: Patient Spontanous Breathing and Patient connected to face mask oxygen  Post-op Assessment: Report given to RN and Post -op Vital signs reviewed and stable  Post vital signs: Reviewed and stable  Last Vitals:  Vitals Value Taken Time  BP 131/90 01/13/24 15:29  Temp    Pulse 84 01/13/24 15:30  Resp 22 01/13/24 15:30  SpO2 100 % 01/13/24 15:30  Vitals shown include unfiled device data.  Last Pain:  Vitals:   01/13/24 1109  TempSrc:   PainSc: 8       Patients Stated Pain Goal: 2 (01/13/24 1052)  Complications: No notable events documented.

## 2024-01-13 NOTE — Op Note (Signed)
 Orthopedic Spine Surgery Operative Report  Procedure: C5/6 and C6/7 anterior cervical discectomy and fusion C5/6 and C6/7 placement of structural allograft interbody spacer C5, C6, C7 anterior plate instrumentation Intraoperative use of microscope  Modifier: none  Date of procedure: 01/13/2024  Patient name: Loretta Ellis MRN: [id number] DOB: 1986/04/28  Surgeon: Ozell Ada, MD Assistant: none Pre-operative diagnosis: cervical radiculopathy, C5-7 foraminal stenosis Post-operative diagnosis: same as above Findings: small anterior osteophyte formation from C5-7  Specimens: none Anesthesia: general EBL: 30cc Complications: none Pre-incision antibiotic: ancef  Pre-incision decadron : 10mg  TXA was given prior to incision as well  Implants:  Implant Name Type Inv. Item Serial No. Manufacturer Lot No. LRB No. Used Action  LOG [id number] - TITAN #508 CERVICAL IMPLANT SET - 1          [employer]OGRAFT [employer] CC [id number] - [id number] Bone Implant [employer] [id number] [employer]  N/A 1 Implanted  ALLOGRAFT TRIAD [employer] CC - [id number] Bone Implant ALLOGRAFT TRIAD [employer] CC [id number] [employer]  N/A 1 Implanted  PLATE ACP 8.3K63 2LVL - [id number] Plate PLATE ACP 8.3K63 2LVL  [employer]  N/A 1 Implanted  SCREW ACP VA SD 3.5X15 - [id number] Screw SCREW ACP VA SD 3.5X15  [employer]  N/A 6 Implanted      Indication for procedure: Patient is a 38 y.o. female who presented to the office with symptoms consistent with cervical radiculopathy. The patient had tried conservative treatments that did not provide any lasting relief. As result, operative management was discussed. The pre-operative MRI showed stenosis from C5-7 so a C5-7 anterior cervical discectomy and fusion was presented as a treatment option. The risks, including but not limited to pseudarthrosis, dysphagia, hematoma, airway compromise, recurrent laryngeal nerve injury, esophageal perforation,  durotomy, spinal cord injury, nerve root injury, persistent pain, adjacent segment disease, infection, bleeding, hardware failure, vascular injury, heart attack, death, stroke, dvt/pe, and need for additional procedures were discussed with the patient. The benefit of surgery would be improvement in the patient's radiating arm pain. Explained that the patient may not get full relief of her pain with this surgery, especially any neck pain. The alternatives to surgical management would be continued monitoring, physical therapy, over-the-counter pain medications, injections, traction, and activity modification. All the patient's questions were answered to her satisfaction. After this discussion, the patient expressed understanding and elected to proceed with surgical intervention.  Procedure Description: The patient was met in the pre-operative holding area. The patient's identity and consent were verified. The operative site was marked. The patient's remaining questions about the surgery were answered. The patient was brought back to the operating room. General anesthesia was induced and an endotracheal tube was placed by the anesthesia staff. The patient was transferred to the flat top [place] table in the supine position. All bony prominences were well padded. A bump was placed underneath the patient's shoulders to extend the neck slightly.  The patient's shoulders were than taped to the bed to improve the fluoroscopic visualization during the procedure. The surgical area was cleansed with alcohol. Fluoroscopy was then brought in to check rotation on the AP image and to mark the levels on the lateral image. The patient's skin was then prepped and draped in a standard, sterile fashion. A time out was performed that identified the patient, the procedure, and the operative levels. All team members agreed with what was stated in the time out.   A left-sided transverse incision was made in the middle of the previously  marked levels.  Incision was taken sharply down through the skin and dermis. Electrocautery was used to dissect down to the level of the platysma. A Metzenbaum scissors was used to elevate flaps both cranially and caudally above the platysma. A small opening was made in the platysma with the Metzenbaum scissors. The scissors were then placed under the platysma and electrocautery was used on top of the scissors to split the platysma. Blunt dissection was carried out medial to the sternocleidomastoid to identify the omohyoid. The omohyoid was then dissected over its lateral aspect with the Metzenbaum scissors. An appendiceal retractor was placed around the carotid sheath to retract it laterally and a cloward retractor was placed medially to retractor the esophagus and trachea medially. The interval between these structures was then bluntly dissected with the Metzenbaum scissors. At this point, the prevertebral fascia was visible within the wound. A kittner was used to dissect the prevertebral fascia off the vertebral bodies and discs. A metallic sucker tip was placed over the disc thought to be the correct level. A lateral fluoroscopic shot was then used to confirm the level. Once the correct level was identified, electrocautery was used to mark the disc space. The retractors that were previously placed were then put back into the wound. Electrocautery was used to elevate the longus coli off the bone on each side at C5/6 aand C6/7.   Shadowline retractor blades were then placed under the longus coli on each side and the self-retainer was attached to these retractor blades at C6/7. The operative microscope was then brought in to improve lighting and visualization. A long handle fifteen blade was then used to incise the C6/7 disc space along the endplates and longitudinally to connect these end plate incisions to create a rectangular incision in the disc. A pituitary was used to remove the incised disc material. A 2  kerrison was used to remove the anterior osteophyte and square the inferior endplate of the cranial vertebra. A combination of straight and curved curettes were used to remove further disc material and the cartilaginous aspect of the endplates. This was done until both the inferior and superior endplates had been prepared from the uncus to uncus and from ventral vertebral body to the posterior osteophytes. A burr was then used to remove the posterior vertebral osteophytes. Once the PLL was visualized within the wound, a nerve hook was used to develop the plane between the PLL and the dura. While lifting up the PLL with the nerve hook, a 1 kerrison was placed under the PLL and used to remove the PLL. Once some of the PLL had been removed, a combination of 1 and 2 kerrisons were used to continue removing the PLL until it had been completely removed. A curved curette was then placed into the foramen and pointed ventrally. It was used to remove soft tissue and the deep aspect of the uncus. A nerve hook was then easily passed into the foramen. The same process was repeated for the other foramen. Lateral fluoroscopic imaging was used to guide the insertion of trials into the disc space in a serial fashion, starting with the smallest trial. AP and lateral fluoroscopic images were obtained and confirmed satisfactory position of the trial. A 7 structural allograft interbody spacer was selected for use as the final implant.  The interbody implant was then placed into the prepared disc space and lightly malleted into place. AP and lateral fluoroscopic images was obtained and confirmed satisfactory position of the final implant.  The retractor blades  were removed from the wound. Then, the same steps in the above paragraph were repeated for the C5/6 disc space except that a 6 trial was a better fit so a 6 structural allograft interbody spacer was selected as the final implant. AP and lateral fluoroscopic images of the entire C  spine were obtained which showed satisfactory positioning of all the interbody implants.   The microscope was then moved away from the operative field and the retractor blades were removed from the wound. A cloward was then used to retract the esophagus and trachea away from the ventral vertebral bodies and another was used to retract the carotid and sternocleidomastoid laterally. A 36mm plate was then selected and placed over the ventral aspects of the vertebral bodies. No soft tissue structures were underneath the plate. An awl was put into the wound and put into one of the screw holes in the plate. It was aimed slightly medially. A 15mm screw was then inserted into the hole created with the awl. The same steps were repeated with the awl and screw to place the same sized screws in the remaining holes within the plate. Again, the wound was checked and no soft tissue structures were seen underneath the plate.   Final AP and lateral fluoroscopic films were taken and confirmed satisfactory position of the plate and interbody implants. The screws were final tightened. Hemostasis was achieved. A penrose drain was placed into the wound. The platysma was reapproximated using 0 vicryl. The deep dermal layer was closed with 2-0 vicryl. The skin was closed with a 3-0 monocryl. All counts were correct at the end of the procedure. Benzoine and steri strips were used over the incision area. The wound was dressed with 4x4 gauze and paper tape. The patient was awakened from anesthesia and transferred to a bed. The patient was brought back to the post-anesthesia care unit in stable condition by the anesthesia staff.   Post-operative plan: The patient will recover in the post-anesthesia care unit and then go to the floor. The patient will receive two post-operative doses of ancef . She will get another dose of TXA. The patient will be out of bed as tolerated. She does not need a collar or brace. The patient will work with  physical therapy. The drain will be removed on post-operative day one. The patient will likely discharge to home tomorrow.    Ozell Ada, MD Orthopedic Surgeon

## 2024-01-13 NOTE — Anesthesia Procedure Notes (Signed)
 Procedure Name: Intubation Date/Time: 01/13/2024 12:02 PM  Performed by: Lyle Delon LABOR, CRNAPre-anesthesia Checklist: Patient identified, Emergency Drugs available, Suction available and Patient being monitored Patient Re-evaluated:Patient Re-evaluated prior to induction Oxygen Delivery Method: Circle system utilized Preoxygenation: Pre-oxygenation with 100% oxygen Induction Type: IV induction Ventilation: Mask ventilation without difficulty Laryngoscope Size: 3 and Glidescope Tube type: Oral Tube size: 7.0 mm Number of attempts: 1 Airway Equipment and Method: Stylet and Oral airway Placement Confirmation: ETT inserted through vocal cords under direct vision, positive ETCO2 and breath sounds checked- equal and bilateral Tube secured with: Tape Dental Injury: Teeth and Oropharynx as per pre-operative assessment  Comments: Head/neck neutral position during intubation

## 2024-01-13 NOTE — Plan of Care (Signed)

## 2024-01-14 ENCOUNTER — Observation Stay (HOSPITAL_COMMUNITY)

## 2024-01-14 ENCOUNTER — Other Ambulatory Visit (HOSPITAL_COMMUNITY): Payer: Self-pay

## 2024-01-14 DIAGNOSIS — M5412 Radiculopathy, cervical region: Secondary | ICD-10-CM | POA: Diagnosis not present

## 2024-01-14 MED ORDER — SENNA 8.6 MG PO TABS
1.0000 | ORAL_TABLET | Freq: Two times a day (BID) | ORAL | 0 refills | Status: AC
  Filled 2024-01-14: qty 28, 14d supply, fill #0

## 2024-01-14 MED ORDER — METHOCARBAMOL 500 MG PO TABS
500.0000 mg | ORAL_TABLET | Freq: Four times a day (QID) | ORAL | 0 refills | Status: AC
  Filled 2024-01-14: qty 40, 10d supply, fill #0

## 2024-01-14 MED ORDER — HYDROCODONE-ACETAMINOPHEN 5-325 MG PO TABS
1.0000 | ORAL_TABLET | ORAL | 0 refills | Status: DC | PRN
  Filled 2024-01-14: qty 40, 4d supply, fill #0

## 2024-01-14 NOTE — Anesthesia Postprocedure Evaluation (Signed)
 Anesthesia Post Note  Patient: Loretta Ellis  Procedure(s) Performed: ANTERIOR CERVICAL DECOMPRESSION/DISCECTOMY FUSION 2 LEVELS (Spine Cervical)     Patient location during evaluation: PACU Anesthesia Type: General Level of consciousness: awake and alert Pain management: pain level controlled Vital Signs Assessment: post-procedure vital signs reviewed and stable Respiratory status: spontaneous breathing, nonlabored ventilation and respiratory function stable Cardiovascular status: stable and blood pressure returned to baseline Anesthetic complications: no   No notable events documented.  Last Vitals:  Vitals:   01/14/24 0610 01/14/24 0734  BP: (!) 148/84 (!) 146/82  Pulse: (!) 48 69  Resp: 18 19  Temp: 36.9 C 36.8 C  SpO2: 100% 100%    Last Pain:  Vitals:   01/14/24 0927  TempSrc:   PainSc: 6                  Debby FORBES Like

## 2024-01-14 NOTE — Progress Notes (Signed)
 Orthopedic Tech Progress Note Patient Details:  Loretta Ellis 04-08-1986 [id number] Aspen collar was given to the occupational therapist for application.  Ortho Devices Type of Ortho Device: Aspen cervical collar Ortho Device/Splint Interventions: Ordered      [name] 01/14/2024, 8:29 AM

## 2024-01-14 NOTE — Discharge Summary (Signed)
 Orthopedic Surgery Discharge Summary  Patient name: Loretta Ellis Patient MRN: [id number] Admit today: 01/13/2024 Discharge date: 01/14/2024  Attending physician: Ozell Ada, MD Final diagnosis: cervical radiculopathy Findings: small anterior osteophyte formation from C5-7   Hospital course: Patient is a 38 y.o. female who was admitted after undergoing C5-7 anterior cervical discectomy and fusion. The patient had significant pain immediately after surgery, but pain eventually was controlled with a multimodal regimen including Norco. The patient worked with physical therapy who recommended discharge to home. The patient was tolerating an oral diet without issue and was voiding spontaneously after surgery. The patient's vitals were stable on the day of discharge. The patient's drain was removed on the day of discharge. The patient was medically ready for discharge and was discharge to home on post-operative day one.  Instructions:   Orthopedic Surgery Discharge Instructions  Patient name: Loretta Ellis Procedure Performed: C5-7 anterior cervical discectomy and fusion Date of Surgery: 01/13/2024 Surgeon: Ozell Ada, MD  Pre-operative Diagnosis: cervical radiculopathy Post-operative Diagnosis: same as above   Discharge Date: 01/14/2024 Discharged to: home Discharge Condition: stable  Activity: You can use the cervical collar you were given if you are more comfortable in it but you do not need to wear it. You should refrain from bending, lifting, or twisting with objects greater than ten pounds until three months after surgery. You are encouraged to walk as much as desired. You can perform household activities such as cleaning dishes, doing laundry, vacuuming, etc. as long as the ten-pound restriction is followed.  Incision Care: Your incision site has a dressing over it. That dressing should remain in place and dry at all times for a total of one week after surgery. After one week,  you can remove the dressing. Underneath the dressing, you will find pieces of tape. You should leave these pieces of tape in place. They will fall off with time. Do not pick, rub, or scrub at them. Do not put cream or lotion over the surgical area. After one week and once the dressing is off, it is okay to let soap and water run over your incision. Again, do not pick, scrub, or rub at the pieces of tape when bathing. Do not submerge (e.g., take a bath, swim, go in a hot tub, etc.) until six weeks after surgery. There may be some bloody drainage from the incision into the dressing after surgery. This is normal. You do not need to replace the dressing. Continue to leave it in place for the one week as instructed above. Should the dressing become saturated with blood or drainage, please call the office for further instructions.   Medications: You have been prescribed Norco. This is a narcotic pain medication and should only be taken as prescribed. You should not drink alcohol or operate heavy machinery (including driving) while taking this medication. The Norco can cause constipation as a side effect. For that reason, you have been prescribed senna and miralax . These are both laxatives. You do not need to take this medication if you develop diarrhea. Should you remain constipated even while taking these medications, please increase the dose of miralax  to twice daily. Tylenol  is in the Norco so do not take additional tylenol  when using the Norco. Robaxin  is a muscle relaxer that has been prescribed to you for muscle spasm type pain. Take this medication as needed.   Do not take NSAIDs (ibuprofen , Aleve , Celebrex , naproxen , meloxicam, etc.) for the first 6 weeks after surgery as there is  some evidence that their use may decrease the chances of successful fusion.   In order to set expectations for opioid prescriptions, you will only be prescribed opioids for a total of six weeks after surgery and, at two-weeks after  surgery, your opioid prescription will start to tapered (decreased dosage and number of pills). If you have ongoing need for opioid medication six weeks after surgery, you will be referred to pain management. If you are already established with a provider that is giving you opioid medications, you should schedule an appointment with them for six weeks after surgery if you feel you are going to need another prescription. State law only allows for opioid prescriptions one week at a time. If you are running out of opioid medication near the end of the week, please call the office during business hours before running out so I can send you another prescription.   You may resume any home blood thinners (aspirin, warfarin, lovenox, apixaban, plavix, xarelto, etc.) 72 hours after your surgery. Take these medications as they were previously prescribed.   Driving: You should not drive while taking narcotic pain medications. You should start getting back to driving slowly and you may want to try driving in a parking lot before doing anything more.   Diet: You are safe to resume your regular diet. A common complication after cervical spine surgery is trouble swallowing especially if the incision was made over the front part of your neck. This complication happens often and, the vast majority of the time, it is a temporary problem that gets better with time. The first few days after surgery, you should take more bites than normal to break the food into smaller pieces before swallowing. With time as the swallowing becomes easier, you can gradually get back to taking bites like you normally would.   Reasons to Call the Office After Surgery: You should feel free to call the office with any concerns or questions you have in the post-operative period, but you should definitely notify the office if you develop: -shortness of breath, chest pain, or trouble breathing -excessive bleeding, drainage, redness, or swelling around  the surgical site -fevers, chills, or pain that is getting worse with each passing day -persistent nausea or vomiting -new weakness in any extremity, new or worsening numbness or tingling in any extremity -numbness in the groin, bowel or bladder incontinence -other concerns about your surgery  Follow Up Appointments: You should have an office appointment scheduled for approximately two weeks after surgery. If you do not remember when this appointment is or do not already have it scheduled, please call the office to schedule.   Office Information:  -Ozell Ada, MD -Phone number: [phone] -Address: [address]       [place], [place] [address]

## 2024-01-14 NOTE — Progress Notes (Signed)
Patient is discharged from room 3C07 at this time. Alert and in stable condition. IV site d/c'd and instructions read to patient with understanding verbalized and all questions answered. Left unit via wheelchair with all belongings at side. 

## 2024-01-14 NOTE — Progress Notes (Signed)
 Orthopedic Surgery Post-operative Progress Note  Assessment: Patient is a 38 y.o. female who is currently admitted after undergoing C5-7 ACDF   Plan: -Operative plans complete -Needs upright films when able -Drain removed this morning -Out of bed as tolerated, can use aspen collar for comfort -No bending/lifting/twisting greater than 10 pounds -OT evaluation and treat -Pain control -Diabetic diet (I realize patient is not diabetic but they diabetic diet still allow for cake, orange juice, and pancakes at [employer] so I placed her on the diabetic diet to minimize her carbohydrates and so she is eating other food groups) -Ancef  x2 post-operative doses -No antiplatelet or dvt chemoprophylaxis for 72 hours after surgery -Anticipate discharge to home today  _________________________________________________________________________  Subjective: No acute events overnight. Radiating arm pain better. Neck pain well controlled with current medications. Tolerated breakfast with a sore throat but was able to eat/drink everything.   Objective:  General: no acute distress, appropriate affect Neurologic: alert, answering questions appropriately, following commands Respiratory: unlabored breathing on room air Neck: no hematoma appreciated Skin: dressing clear/dry/intact  MSK (spine):  -Strength exam      Right  Left Grip strength                5/5  5/5 Interosseus   5/5   5/5 Wrist extension  5/5  5/5 Wrist flexion   5/5  5/5 Elbow flexion   5/5  5/5 Deltoid    5/5  5/5  -Sensory exam    Sensation intact to light touch in C5-T1 nerve distributions of bilateral upper extremities   Yesterday's total administered Morphine  Milligram Equivalents: 145   Patient name: Loretta Ellis Patient MRN: [id number] Date: 01/14/24

## 2024-01-14 NOTE — Discharge Instructions (Signed)
 Orthopedic Surgery Discharge Instructions  Patient name: Loretta Ellis Procedure Performed: C5-7 anterior cervical discectomy and fusion Date of Surgery: 01/13/2024 Surgeon: Ozell Ada, MD  Pre-operative Diagnosis: cervical radiculopathy Post-operative Diagnosis: same as above   Discharge Date: 01/14/2024 Discharged to: home Discharge Condition: stable  Activity: You can use the cervical collar you were given if you are more comfortable in it but you do not need to wear it. You should refrain from bending, lifting, or twisting with objects greater than ten pounds until three months after surgery. You are encouraged to walk as much as desired. You can perform household activities such as cleaning dishes, doing laundry, vacuuming, etc. as long as the ten-pound restriction is followed.  Incision Care: Your incision site has a dressing over it. That dressing should remain in place and dry at all times for a total of one week after surgery. After one week, you can remove the dressing. Underneath the dressing, you will find pieces of tape. You should leave these pieces of tape in place. They will fall off with time. Do not pick, rub, or scrub at them. Do not put cream or lotion over the surgical area. After one week and once the dressing is off, it is okay to let soap and water run over your incision. Again, do not pick, scrub, or rub at the pieces of tape when bathing. Do not submerge (e.g., take a bath, swim, go in a hot tub, etc.) until six weeks after surgery. There may be some bloody drainage from the incision into the dressing after surgery. This is normal. You do not need to replace the dressing. Continue to leave it in place for the one week as instructed above. Should the dressing become saturated with blood or drainage, please call the office for further instructions.   Medications: You have been prescribed Norco. This is a narcotic pain medication and should only be taken as prescribed. You  should not drink alcohol or operate heavy machinery (including driving) while taking this medication. The Norco can cause constipation as a side effect. For that reason, you have been prescribed senna and miralax . These are both laxatives. You do not need to take this medication if you develop diarrhea. Should you remain constipated even while taking these medications, please increase the dose of miralax  to twice daily. Tylenol  is in the Norco so do not take additional tylenol  when using the Norco. Robaxin  is a muscle relaxer that has been prescribed to you for muscle spasm type pain. Take this medication as needed.   Do not take NSAIDs (ibuprofen , Aleve , Celebrex , naproxen , meloxicam, etc.) for the first 6 weeks after surgery as there is some evidence that their use may decrease the chances of successful fusion.   In order to set expectations for opioid prescriptions, you will only be prescribed opioids for a total of six weeks after surgery and, at two-weeks after surgery, your opioid prescription will start to tapered (decreased dosage and number of pills). If you have ongoing need for opioid medication six weeks after surgery, you will be referred to pain management. If you are already established with a provider that is giving you opioid medications, you should schedule an appointment with them for six weeks after surgery if you feel you are going to need another prescription. State law only allows for opioid prescriptions one week at a time. If you are running out of opioid medication near the end of the week, please call the office during business hours  before running out so I can send you another prescription.   You may resume any home blood thinners (aspirin, warfarin, lovenox, apixaban, plavix, xarelto, etc.) 72 hours after your surgery. Take these medications as they were previously prescribed.   Driving: You should not drive while taking narcotic pain medications. You should start getting back to  driving slowly and you may want to try driving in a parking lot before doing anything more.   Diet: You are safe to resume your regular diet. A common complication after cervical spine surgery is trouble swallowing especially if the incision was made over the front part of your neck. This complication happens often and, the vast majority of the time, it is a temporary problem that gets better with time. The first few days after surgery, you should take more bites than normal to break the food into smaller pieces before swallowing. With time as the swallowing becomes easier, you can gradually get back to taking bites like you normally would.   Reasons to Call the Office After Surgery: You should feel free to call the office with any concerns or questions you have in the post-operative period, but you should definitely notify the office if you develop: -shortness of breath, chest pain, or trouble breathing -excessive bleeding, drainage, redness, or swelling around the surgical site -fevers, chills, or pain that is getting worse with each passing day -persistent nausea or vomiting -new weakness in any extremity, new or worsening numbness or tingling in any extremity -numbness in the groin, bowel or bladder incontinence -other concerns about your surgery  Follow Up Appointments: You should have an office appointment scheduled for approximately two weeks after surgery. If you do not remember when this appointment is or do not already have it scheduled, please call the office to schedule.   Office Information:  -Ozell Ada, MD -Phone number: [phone] -Address: [address]       [place], [place] [address]

## 2024-01-14 NOTE — Evaluation (Signed)
 Occupational Therapy Evaluation Patient Details Name: Loretta Ellis MRN: [id number] DOB: July 01, 1985 Today's Date: 01/14/2024   History of Present Illness   Pt is a 38 y/o F s/p C5-6, C6-7 ACDF on 9/5. PMH Includes STIs, tonsillectomy, adenoidectomy.     Clinical Impressions Pt ind at baseline with ADL/functional mobility, working as a Social worker. Lives with family. Pt currently needing up to min A for ADLs, ind with bed mobility and CGA for transfers without AD. Pt up in bathroom upon arrival, reports mobilizing in hall earlier this AM. No collar present in room at this time, per most recent ortho note OOB in collar as tolerated, asked RN to order collar, did not place once arrived due to bulky bandaging and drain still present. Reviewed cervical precautions (handout provided), collar wear, and compensatory strategies for ADLs/mobility, pt verbalizes and demo's understanding. Pt presenting with impairments listed below, will follow acutely. Anticipate no OT follow up needs at d/c.      If plan is discharge home, recommend the following:   A little help with walking and/or transfers;A little help with bathing/dressing/bathroom;Assistance with cooking/housework;Direct supervision/assist for financial management;Direct supervision/assist for medications management;Assist for transportation;Help with stairs or ramp for entrance;Supervision due to cognitive status     Functional Status Assessment   Patient has had a recent decline in their functional status and demonstrates the ability to make significant improvements in function in a reasonable and predictable amount of time.     Equipment Recommendations   BSC/3in1     Recommendations for Other Services         Precautions/Restrictions   Precautions Precautions: Cervical Precaution Booklet Issued: Yes (comment) Required Braces or Orthoses: Cervical Brace Cervical Brace: Hard collar;Other (comment) (per op note, OOB with  collar, no collar present on arrival, RN ordered and collar arrived toward end of session, educated on collar wear and to confirm with MD as no orders in order set. Pt also with bulky bandage and drain to anterior neck so did not don collar)     Mobility Bed Mobility Overal bed mobility: Independent                  Transfers Overall transfer level: Needs assistance Equipment used: None Transfers: Sit to/from Stand Sit to Stand: Contact guard assist                  Balance Overall balance assessment: Mild deficits observed, not formally tested                                         ADL either performed or assessed with clinical judgement   ADL Overall ADL's : Needs assistance/impaired Eating/Feeding: Set up;Sitting   Grooming: Set up;Sitting   Upper Body Bathing: Contact guard assist;Sitting   Lower Body Bathing: Minimal assistance;Sitting/lateral leans   Upper Body Dressing : Contact guard assist;Sitting   Lower Body Dressing: Minimal assistance;Sitting/lateral leans   Toilet Transfer: Contact guard assist;Ambulation;Regular Toilet   Toileting- Clothing Manipulation and Hygiene: Contact guard assist       Functional mobility during ADLs: Contact guard assist       Vision   Vision Assessment?: No apparent visual deficits     Perception Perception: Not tested       Praxis Praxis: Not tested       Pertinent Vitals/Pain Pain Assessment Pain Assessment: Faces Pain Score: 3  Faces  Pain Scale: Hurts little more Pain Location: ant neck/incision site Pain Descriptors / Indicators: Discomfort Pain Intervention(s): Limited activity within patient's tolerance, Monitored during session, Repositioned     Extremity/Trunk Assessment Upper Extremity Assessment Upper Extremity Assessment: Generalized weakness   Lower Extremity Assessment Lower Extremity Assessment: Defer to PT evaluation   Cervical / Trunk Assessment Cervical /  Trunk Assessment: Normal   Communication Communication Communication: No apparent difficulties   Cognition Arousal: Alert Behavior During Therapy: WFL for tasks assessed/performed Cognition: No apparent impairments                               Following commands: Intact       Cueing  General Comments   Cueing Techniques: Verbal cues;Gestural cues  VSS   Exercises     Shoulder Instructions      Home Living Family/patient expects to be discharged to:: Private residence Living Arrangements: Spouse/significant other;Children Available Help at Discharge: Family;Available PRN/intermittently (after 3pm) Type of Home: House Home Access: Level entry     Home Layout: Two level;Bed/bath upstairs Alternate Level Stairs-Number of Steps: flight   Bathroom Shower/Tub: Walk-in shower         Home Equipment: None          Prior Functioning/Environment Prior Level of Function : Independent/Modified Independent;Driving;Working/employed               ADLs Comments: works as a Hydrologist List: Decreased strength;Decreased range of motion;Decreased activity tolerance;Impaired balance (sitting and/or standing);Impaired vision/perception;Decreased cognition   OT Treatment/Interventions: Therapeutic exercise;Self-care/ADL training;Energy conservation;DME and/or AE instruction;Therapeutic activities;Patient/family education;Balance training      OT Goals(Current goals can be found in the care plan section)   Acute Rehab OT Goals Patient Stated Goal: none stated OT Goal Formulation: With patient Time For Goal Achievement: 01/28/24 Potential to Achieve Goals: Good   OT Frequency:  Min 1X/week    Co-evaluation              AM-PAC OT 6 Clicks Daily Activity     Outcome Measure Help from another person eating meals?: A Little Help from another person taking care of personal grooming?: A Little Help from another person toileting,  which includes using toliet, bedpan, or urinal?: A Little Help from another person bathing (including washing, rinsing, drying)?: A Little Help from another person to put on and taking off regular upper body clothing?: A Little Help from another person to put on and taking off regular lower body clothing?: A Little 6 Click Score: 18   End of Session Nurse Communication: Mobility status;Other (comment) (per op not needs collar, none in order set)  Activity Tolerance: Patient tolerated treatment well Patient left: in bed;with call bell/phone within reach  OT Visit Diagnosis: Unsteadiness on feet (R26.81);Other abnormalities of gait and mobility (R26.89);Muscle weakness (generalized) (M62.81)                Time: 9187-9160 OT Time Calculation (min): 27 min Charges:  OT General Charges $OT Visit: 1 Visit OT Evaluation $OT Eval Low Complexity: 1 Low OT Treatments $Self Care/Home Management : 8-22 mins  Avanish Cerullo K, OTD, OTR/L SecureChat Preferred Acute Rehab [phone]   Loretta Ellis 01/14/2024, 8:55 AM

## 2024-01-15 ENCOUNTER — Encounter: Payer: Self-pay | Admitting: Orthopedic Surgery

## 2024-01-16 ENCOUNTER — Encounter (HOSPITAL_COMMUNITY): Payer: Self-pay | Admitting: Orthopedic Surgery

## 2024-01-18 ENCOUNTER — Telehealth: Payer: Self-pay | Admitting: Orthopedic Surgery

## 2024-01-18 MED ORDER — HYDROCODONE-ACETAMINOPHEN 5-325 MG PO TABS: 1.0000 | ORAL_TABLET | ORAL | 0 refills | Status: DC | PRN

## 2024-01-18 NOTE — Telephone Encounter (Signed)
 Send to [facility] [address]

## 2024-01-18 NOTE — Telephone Encounter (Signed)
 Patient's grandmother called on behalf of patient. She says that patient had spasms overnight last night, and is asking if there is anything she can do tonight to prevent those from happening again. She did take her muscle relaxer and Hydrocodone  last night, and says that she has had a heating pad on her back/neck since this morning. Patient is also concerned about her right pointer finger locking whenever she tries to open anything. Grandmother's CB#[phone].

## 2024-01-18 NOTE — Addendum Note (Signed)
 Addended by: GEORGINA SHARPER on: 01/18/2024 05:32 PM   Modules accepted: Orders

## 2024-01-18 NOTE — Telephone Encounter (Signed)
 Needs refill on Hydrocodone

## 2024-01-20 ENCOUNTER — Encounter: Payer: Self-pay | Admitting: Orthopedic Surgery

## 2024-01-20 ENCOUNTER — Telehealth: Payer: Self-pay | Admitting: Orthopedic Surgery

## 2024-01-20 NOTE — Telephone Encounter (Signed)
Patient called. She would like Christy to call her.

## 2024-01-22 ENCOUNTER — Encounter: Payer: Self-pay | Admitting: Orthopedic Surgery

## 2024-01-25 ENCOUNTER — Encounter: Payer: Self-pay | Admitting: Orthopedic Surgery

## 2024-01-25 MED ORDER — HYDROCODONE-ACETAMINOPHEN 5-325 MG PO TABS: 1.0000 | ORAL_TABLET | ORAL | 0 refills | Status: AC | PRN

## 2024-01-26 ENCOUNTER — Ambulatory Visit: Admitting: Orthopedic Surgery

## 2024-01-26 ENCOUNTER — Other Ambulatory Visit (INDEPENDENT_AMBULATORY_CARE_PROVIDER_SITE_OTHER): Payer: Self-pay

## 2024-01-26 DIAGNOSIS — Z981 Arthrodesis status: Secondary | ICD-10-CM | POA: Diagnosis not present

## 2024-01-26 MED ORDER — METHOCARBAMOL 500 MG PO TABS: 500.0000 mg | ORAL_TABLET | Freq: Four times a day (QID) | ORAL | 0 refills | Status: AC

## 2024-01-26 NOTE — Progress Notes (Signed)
 Orthopedic Surgery Post-operative Office Visit  Procedure: C5-7 ACDF Date of Surgery: 01/13/2024 (~2 weeks post-op)  Assessment: Patient is a 38 y.o. female who has noted significant improvement in her radiating arm pain since surgery   Plan: -Operative plans complete -Out of bed as tolerated, no collar needed  -No bending/lifting/twisting greater than 10 pounds -Okay to let soap/water run over the incision but do not submerge -Pain management: weaning Norco, Robaxin  -Return to office in 4 weeks, as x-rays needed at next visit: AP/lateral cervical  ___________________________________________________________________________   Subjective: Patient has noticed significant improvement in her radiating arm pain since surgery.  She is still has some soreness over the dorsal right forearm.  No other arm pain noted.  Neck pain has been getting better with time.  She is still using Norco to control the pain.  Has not noticed any redness or drainage around her incision.  Objective:  General: no acute distress, appropriate affect Neurologic: alert, answering questions appropriately, following commands Respiratory: unlabored breathing on room air Skin: incision is well-approximated with no erythema, induration, active/depressible drainage  MSK (spine):  -Strength exam      Left  Right Grip strength                5/5  5/5 Interosseus   5/5   5/5 Wrist extension  5/5  5/5 Wrist flexion   5/5  5/5 Elbow flexion   5/5  5/5 Deltoid    5/5  5/5  -Sensory exam   Sensation intact to light touch in C5-T1 nerve distributions of bilateral upper extremities   Imaging: X-rays of the cervical spine from 01/26/2024 were independently reviewed and interpreted, showing allograft interbody devices at C5/6 and C6/7.  Interbody's appear in appropriate position.  There is anterior instrumentation from C5-7.  No lucency seen around the screws.  None of the screws are backed out.  No fracture or dislocation  seen.   Patient name: Loretta Ellis Patient MRN: [id number] Date of visit: 01/26/24

## 2024-02-02 ENCOUNTER — Ambulatory Visit: Admitting: Orthopedic Surgery

## 2024-02-08 ENCOUNTER — Encounter: Payer: Self-pay | Admitting: Orthopedic Surgery

## 2024-02-08 MED ORDER — HYDROCODONE-ACETAMINOPHEN 5-325 MG PO TABS: 1.0000 | ORAL_TABLET | Freq: Four times a day (QID) | ORAL | 0 refills | Status: AC | PRN

## 2024-02-13 ENCOUNTER — Encounter: Admitting: Orthopedic Surgery

## 2024-02-19 ENCOUNTER — Encounter: Payer: Self-pay | Admitting: Orthopedic Surgery

## 2024-02-20 ENCOUNTER — Other Ambulatory Visit: Payer: Self-pay

## 2024-02-20 ENCOUNTER — Ambulatory Visit: Admitting: Orthopedic Surgery

## 2024-02-20 ENCOUNTER — Encounter: Payer: Self-pay | Admitting: Orthopedic Surgery

## 2024-02-20 VITALS — BP 151/94 | HR 65 | Ht 69.0 in | Wt 211.0 lb

## 2024-02-20 DIAGNOSIS — M545 Low back pain, unspecified: Secondary | ICD-10-CM | POA: Diagnosis not present

## 2024-02-20 DIAGNOSIS — Z981 Arthrodesis status: Secondary | ICD-10-CM
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
## 2024-02-27 ENCOUNTER — Encounter: Admitting: Orthopedic Surgery

## 2024-03-12 ENCOUNTER — Encounter: Payer: Self-pay | Admitting: Radiology

## 2024-04-02 ENCOUNTER — Encounter: Admitting: Orthopedic Surgery

## 2024-04-05 ENCOUNTER — Other Ambulatory Visit: Payer: Self-pay

## 2024-04-05 ENCOUNTER — Encounter (HOSPITAL_COMMUNITY): Payer: Self-pay | Admitting: Emergency Medicine

## 2024-04-05 ENCOUNTER — Emergency Department (HOSPITAL_COMMUNITY)

## 2024-04-05 ENCOUNTER — Emergency Department (HOSPITAL_COMMUNITY): Admission: EM | Admit: 2024-04-05 | Discharge: 2024-04-05 | Discharge status: Against Medical Advice

## 2024-04-05 DIAGNOSIS — Z5321 Procedure and treatment not carried out due to patient leaving prior to being seen by health care provider: Secondary | ICD-10-CM | POA: Insufficient documentation

## 2024-04-05 DIAGNOSIS — R1013 Epigastric pain: Secondary | ICD-10-CM | POA: Insufficient documentation

## 2024-04-05 LAB — COMPREHENSIVE METABOLIC PANEL WITH GFR
ALT: 12 U/L (ref 0–44)
AST: 18 U/L (ref 15–41)
Albumin: 3.8 g/dL (ref 3.5–5.0)
Alkaline Phosphatase: 69 U/L (ref 38–126)
Anion gap: 11 (ref 5–15)
BUN: 12 mg/dL (ref 6–20)
CO2: 21 mmol/L — ABNORMAL LOW (ref 22–32)
Calcium: 9.4 mg/dL (ref 8.9–10.3)
Chloride: 107 mmol/L (ref 98–111)
Creatinine, Ser: 0.98 mg/dL (ref 0.44–1.00)
GFR, Estimated: >60 mL/min (ref >60)
Glucose, Bld: 52 mg/dL — ABNORMAL LOW (ref 70–99)
Potassium: 3.7 mmol/L (ref 3.5–5.1)
Sodium: 139 mmol/L (ref 135–145)
Total Bilirubin: 0.4 mg/dL (ref 0.0–1.2)
Total Protein: 7.1 g/dL (ref 6.5–8.1)

## 2024-04-05 LAB — URINALYSIS, ROUTINE W REFLEX MICROSCOPIC
Bacteria, UA: NONE SEEN
Bilirubin Urine: NEGATIVE
Glucose, UA: NEGATIVE mg/dL
Hgb urine dipstick: NEGATIVE
Ketones, ur: NEGATIVE mg/dL
Nitrite: NEGATIVE
Protein, ur: NEGATIVE mg/dL
Specific Gravity, Urine: 1.021 (ref 1.005–1.030)
pH: 5 (ref 5.0–8.0)

## 2024-04-05 LAB — CBC
HCT: 38.9 % (ref 36.0–46.0)
Hemoglobin: 12.1 g/dL (ref 12.0–15.0)
MCH: 27.5 pg (ref 26.0–34.0)
MCHC: 31.1 g/dL (ref 30.0–36.0)
MCV: 88.4 fL (ref 80.0–100.0)
Platelets: 443 K/uL — ABNORMAL HIGH (ref 150–400)
RBC: 4.4 MIL/uL (ref 3.87–5.11)
RDW: 14.6 % (ref 11.5–15.5)
WBC: 10.2 K/uL (ref 4.0–10.5)
nRBC: 0 % (ref 0.0–0.2)

## 2024-04-05 LAB — LIPASE, BLOOD: Lipase: 42 U/L (ref 11–51)

## 2024-04-05 LAB — HCG, SERUM, QUALITATIVE: Preg, Serum: NEGATIVE

## 2024-04-05 LAB — TROPONIN I (HIGH SENSITIVITY): Troponin I (High Sensitivity): 3 ng/L (ref <18)

## 2024-04-05 LAB — D-DIMER, QUANTITATIVE: D-Dimer, Quant: 0.42 ug{FEU}/mL (ref 0.00–0.50)

## 2024-04-05 NOTE — ED Triage Notes (Addendum)
 Patient from home BIB [facility] w/ left sided chest pain, upper abdominal pain radiating to L arm and back. Pink, warm and dry.    Had recent surgery to c-spine fusion in September and has recurrent neck and back pain since this surgery in the last week. The pain has been so great she's had to start taking her prescribed hydrocodones. Since then she has had difficulty having bowel movements.   VS: 140/70 BP CBG 130 HR 85 SR

## 2024-04-05 NOTE — ED Notes (Signed)
 Pt left AMA.  KM

## 2024-04-05 NOTE — ED Provider Triage Note (Signed)
 Emergency Medicine Provider Triage Evaluation Note  Loretta Ellis , a 38 y.o. female  was evaluated in triage.  Pt complains of chest pain/epigastric pain.  She felt like maybe was gas but came to ED for further evaluation.  Patient states that she was able to pass some gas and subsequent felt much better.  No nausea vomit or diarrhea.  Bowel moods been regular.  More epigastric pain.  No significant pleuritic chest pain but instead he states that did go into her chest a little bit.  No history of DVT or PE.  Did have cervical fusion back in September.  Review of Systems  Positive: Chest pain Negative: N/v/d  Physical Exam  BP (!) 147/113   Pulse 69   Temp 98.4 F (36.9 C) (Oral)   Resp 18   Ht 5' 9 (1.753 m)   Wt 99.3 kg   SpO2 100%   BMI 32.34 kg/m  Gen:   Awake, no distress   Resp:  Normal effort  MSK:   Moves extremities without difficulty  Other:    Medical Decision Making  Medically screening exam initiated at 6:54 PM.  Appropriate orders placed.  SABINE TENENBAUM was informed that the remainder of the evaluation will be completed by another provider, this initial triage assessment does not replace that evaluation, and the importance of remaining in the ED until their evaluation is complete.  Cardiac workup troponin x 2  D-dimer given recent surgical intervention but no significant pleuritic chest pain.  No hemoptysis.  O2 saturation are percent in room air.  No tachypnea or tachycardia.    Simon Lavonia SAILOR, MD 04/05/24 [phone]

## 2024-04-18 ENCOUNTER — Other Ambulatory Visit (INDEPENDENT_AMBULATORY_CARE_PROVIDER_SITE_OTHER)

## 2024-04-18 ENCOUNTER — Ambulatory Visit (INDEPENDENT_AMBULATORY_CARE_PROVIDER_SITE_OTHER): Admitting: Orthopedic Surgery

## 2024-04-18 DIAGNOSIS — Z981 Arthrodesis status: Secondary | ICD-10-CM | POA: Diagnosis not present

## 2024-04-18 NOTE — Progress Notes (Signed)
 Orthopedic Surgery Post-operative Office Visit   Procedure: C5-7 ACDF Date of Surgery: 01/13/2024 (~3 months post-op)   Assessment: Patient is a 38 y.o. female who has been doing well since her neck surgery.  No significant neck or radiating arm pain     Plan: -No spine specific precautions -Activity as tolerated -Tylenol  needed for pain control -Return to office in 3 months, x-rays needed at next visit: AP/lateral/flex/ex cervical   ___________________________________________________________________________     Subjective: Patient is not having any significant neck or radiating arm pain at this point.  She is pleased with how her neck surgery has gone so far.   Objective:   General: no acute distress, appropriate affect Neurologic: alert, answering questions appropriately, following commands Respiratory: unlabored breathing on room air Skin: incision is well-healed   MSK (spine):   -Strength exam                                                   Left                  Right Grip strength                5/5                  5/5 Interosseus                  5/5                  5/5 Wrist extension            5/5                  5/5 Wrist flexion                 5/5                  5/5 Elbow flexion                5/5                  5/5 Deltoid                          5/5                  5/5  -Sensory exam                          Sensation intact to light touch in C5-T1 nerve distributions of bilateral upper extremities     Imaging: XRs of the cervical spine from 04/18/2024 were independently reviewed and interpreted, showing interbody devices at C5/6 and C6/7.  There is anterior instrumentation from C5-C7.  No lucency seen around the screws.  No fracture or dislocation seen.     Patient name: Loretta Ellis Patient MRN: [id number] Date of visit: 04/18/24

## 2024-07-18 ENCOUNTER — Ambulatory Visit: Admitting: Orthopedic Surgery
